# Patient Record
Sex: Female | Born: 1970
Health system: Southern US, Community
[De-identification: ages and names within clinical notes are randomized; demographics above are authoritative.]

## PROBLEM LIST (undated history)

## (undated) DIAGNOSIS — T7840XA Allergy, unspecified, initial encounter: Secondary | ICD-10-CM

## (undated) DIAGNOSIS — K219 Gastro-esophageal reflux disease without esophagitis: Secondary | ICD-10-CM

## (undated) DIAGNOSIS — C4491 Basal cell carcinoma of skin, unspecified: Secondary | ICD-10-CM

## (undated) DIAGNOSIS — K200 Eosinophilic esophagitis: Secondary | ICD-10-CM

## (undated) DIAGNOSIS — Q7962 Hypermobile Ehlers-Danlos syndrome: Secondary | ICD-10-CM

## (undated) DIAGNOSIS — M1990 Unspecified osteoarthritis, unspecified site: Secondary | ICD-10-CM

## (undated) DIAGNOSIS — D333 Benign neoplasm of cranial nerves: Secondary | ICD-10-CM

## (undated) DIAGNOSIS — F419 Anxiety disorder, unspecified: Secondary | ICD-10-CM

## (undated) DIAGNOSIS — D649 Anemia, unspecified: Secondary | ICD-10-CM

## (undated) DIAGNOSIS — E785 Hyperlipidemia, unspecified: Secondary | ICD-10-CM

## (undated) DIAGNOSIS — F32A Depression, unspecified: Secondary | ICD-10-CM

## (undated) HISTORY — DX: Basal cell carcinoma of skin, unspecified: C44.91

## (undated) HISTORY — PX: EXCISION VAGINAL CYST: SHX5825

## (undated) HISTORY — DX: Allergy, unspecified, initial encounter: T78.40XA

## (undated) HISTORY — DX: Gastro-esophageal reflux disease without esophagitis: K21.9

## (undated) HISTORY — DX: Anxiety disorder, unspecified: F41.9

## (undated) HISTORY — PX: COLONOSCOPY: SHX174

## (undated) HISTORY — PX: BUNIONECTOMY: SHX129

## (undated) HISTORY — DX: Unspecified osteoarthritis, unspecified site: M19.90

## (undated) HISTORY — DX: Hyperlipidemia, unspecified: E78.5

## (undated) HISTORY — DX: Anemia, unspecified: D64.9

## (undated) HISTORY — PX: UPPER GASTROINTESTINAL ENDOSCOPY: SHX188

## (undated) HISTORY — DX: Hypermobile Ehlers-Danlos syndrome: Q79.62

## (undated) HISTORY — DX: Eosinophilic esophagitis: K20.0

## (undated) HISTORY — DX: Benign neoplasm of cranial nerves: D33.3

## (undated) HISTORY — DX: Depression, unspecified: F32.A

---

## 2000-08-15 ENCOUNTER — Inpatient Hospital Stay (HOSPITAL_COMMUNITY): Admission: AD | Admit: 2000-08-15 | Discharge: 2000-08-18 | Payer: Self-pay | Admitting: Obstetrics and Gynecology

## 2000-08-30 ENCOUNTER — Encounter: Admission: RE | Admit: 2000-08-30 | Discharge: 2000-11-28 | Payer: Self-pay | Admitting: Obstetrics and Gynecology

## 2000-09-22 ENCOUNTER — Other Ambulatory Visit: Admission: RE | Admit: 2000-09-22 | Discharge: 2000-09-22 | Payer: Self-pay | Admitting: Obstetrics and Gynecology

## 2000-12-23 ENCOUNTER — Encounter: Admission: RE | Admit: 2000-12-23 | Discharge: 2001-01-22 | Payer: Self-pay | Admitting: Obstetrics and Gynecology

## 2001-09-26 ENCOUNTER — Other Ambulatory Visit: Admission: RE | Admit: 2001-09-26 | Discharge: 2001-09-26 | Payer: Self-pay | Admitting: Obstetrics and Gynecology

## 2002-10-05 ENCOUNTER — Other Ambulatory Visit: Admission: RE | Admit: 2002-10-05 | Discharge: 2002-10-05 | Payer: Self-pay | Admitting: Obstetrics and Gynecology

## 2003-05-06 ENCOUNTER — Other Ambulatory Visit: Admission: RE | Admit: 2003-05-06 | Discharge: 2003-05-06 | Payer: Self-pay | Admitting: Obstetrics and Gynecology

## 2003-09-16 ENCOUNTER — Other Ambulatory Visit: Admission: RE | Admit: 2003-09-16 | Discharge: 2003-09-16 | Payer: Self-pay | Admitting: Obstetrics and Gynecology

## 2003-10-17 ENCOUNTER — Inpatient Hospital Stay (HOSPITAL_COMMUNITY): Admission: AD | Admit: 2003-10-17 | Discharge: 2003-10-17 | Payer: Self-pay | Admitting: Obstetrics and Gynecology

## 2003-10-18 ENCOUNTER — Inpatient Hospital Stay (HOSPITAL_COMMUNITY): Admission: AD | Admit: 2003-10-18 | Discharge: 2003-10-18 | Payer: Self-pay | Admitting: Obstetrics & Gynecology

## 2003-10-21 ENCOUNTER — Inpatient Hospital Stay (HOSPITAL_COMMUNITY): Admission: AD | Admit: 2003-10-21 | Discharge: 2003-10-26 | Payer: Self-pay | Admitting: *Deleted

## 2003-10-31 ENCOUNTER — Inpatient Hospital Stay (HOSPITAL_COMMUNITY): Admission: AD | Admit: 2003-10-31 | Discharge: 2003-11-03 | Payer: Self-pay | Admitting: *Deleted

## 2003-11-07 ENCOUNTER — Ambulatory Visit (HOSPITAL_COMMUNITY): Admission: RE | Admit: 2003-11-07 | Discharge: 2003-11-07 | Payer: Self-pay | Admitting: Obstetrics and Gynecology

## 2003-11-10 ENCOUNTER — Inpatient Hospital Stay (HOSPITAL_COMMUNITY): Admission: AD | Admit: 2003-11-10 | Discharge: 2003-11-11 | Payer: Self-pay | Admitting: Obstetrics and Gynecology

## 2003-11-11 ENCOUNTER — Ambulatory Visit (HOSPITAL_COMMUNITY): Admission: RE | Admit: 2003-11-11 | Discharge: 2003-11-11 | Payer: Self-pay | Admitting: Obstetrics and Gynecology

## 2003-11-16 ENCOUNTER — Inpatient Hospital Stay (HOSPITAL_COMMUNITY): Admission: AD | Admit: 2003-11-16 | Discharge: 2003-11-18 | Payer: Self-pay | Admitting: Obstetrics and Gynecology

## 2003-12-18 ENCOUNTER — Encounter: Admission: RE | Admit: 2003-12-18 | Discharge: 2004-01-17 | Payer: Self-pay | Admitting: Obstetrics and Gynecology

## 2003-12-27 ENCOUNTER — Other Ambulatory Visit: Admission: RE | Admit: 2003-12-27 | Discharge: 2003-12-27 | Payer: Self-pay | Admitting: Obstetrics and Gynecology

## 2004-02-13 ENCOUNTER — Encounter: Admission: RE | Admit: 2004-02-13 | Discharge: 2004-03-14 | Payer: Self-pay | Admitting: Obstetrics and Gynecology

## 2004-09-26 IMAGING — US US OB LIMITED
1 series · 14 of 25 positions shown · non-contrast
Comparison: none

CLINICAL DATA: Low amniotic fluid volume.

[Series 1: unknown · 0.32mm/px · 14 of 25 slices shown]
[im 1/25]
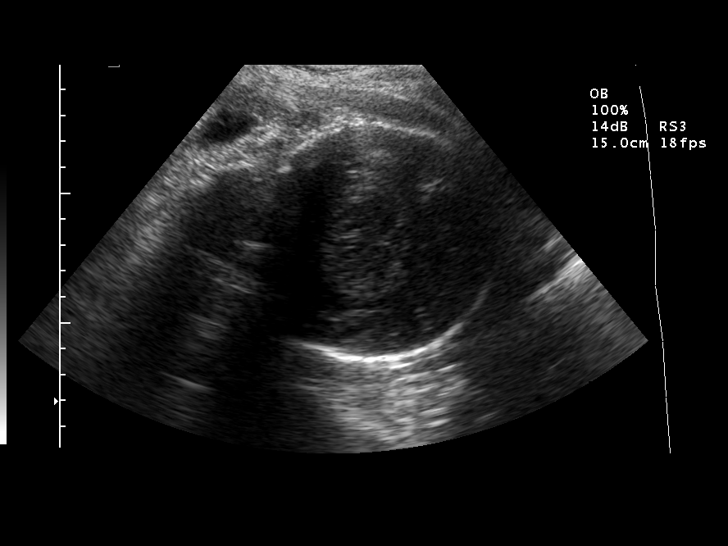
[im 3/25]
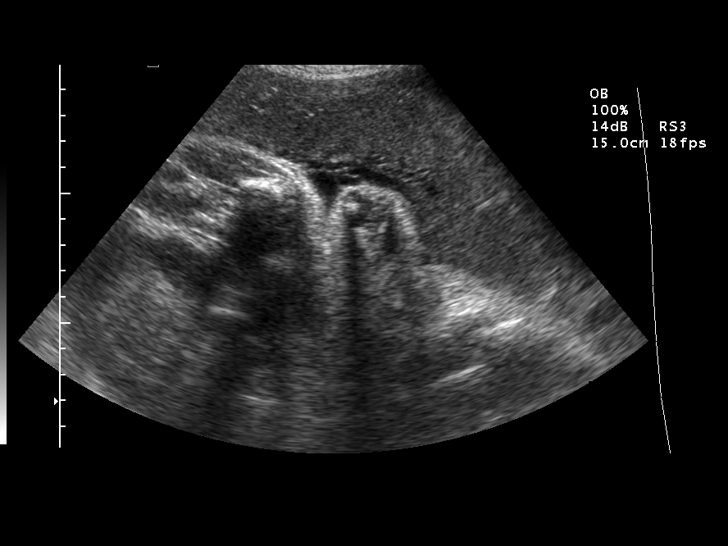
[im 5/25]
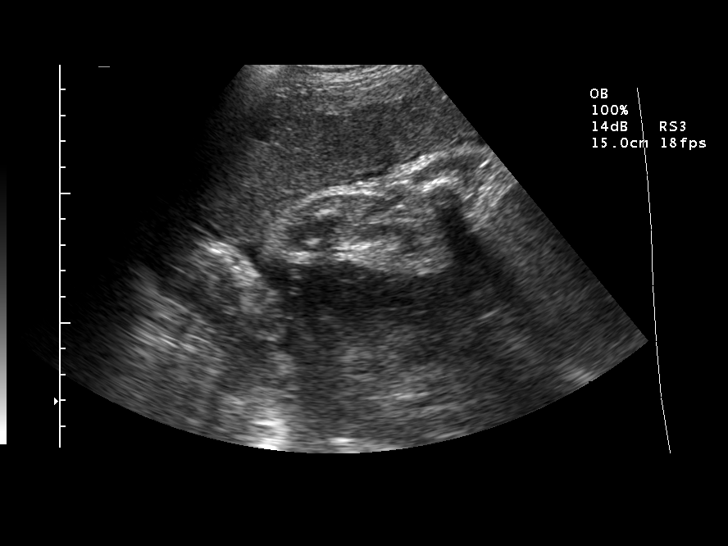
[im 7/25]
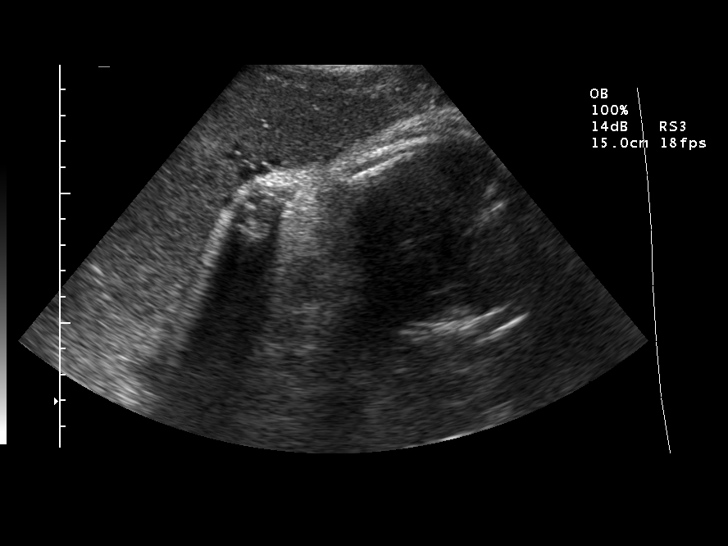
[im 9/25]
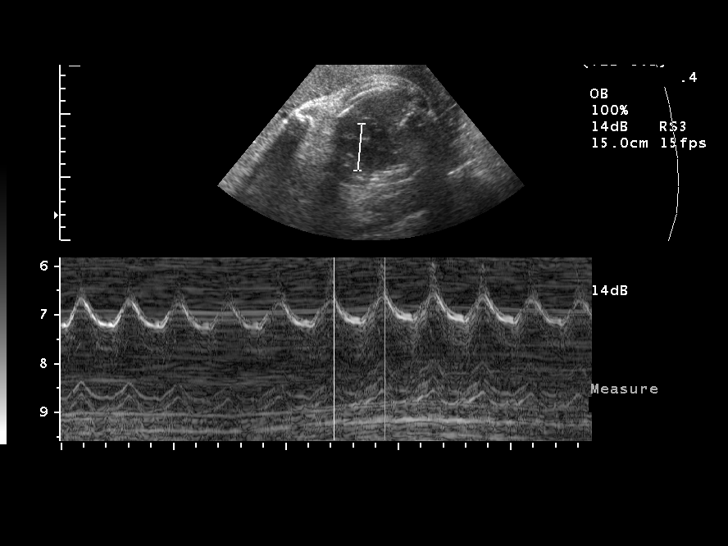
[im 10/25]
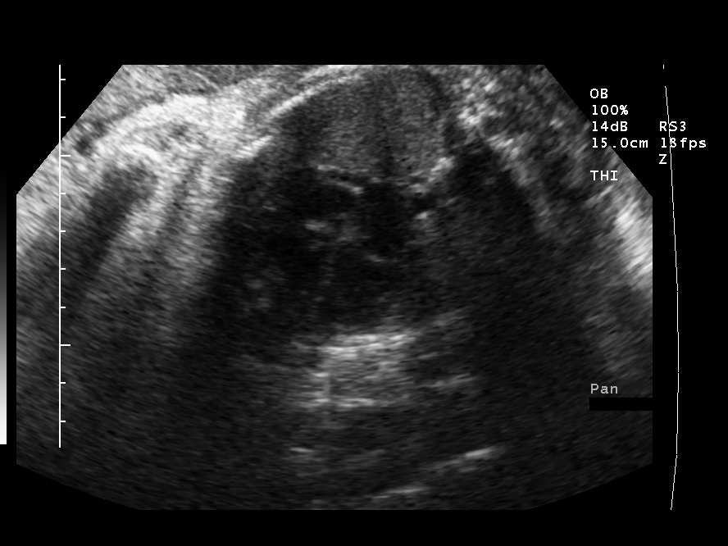
[im 12/25]
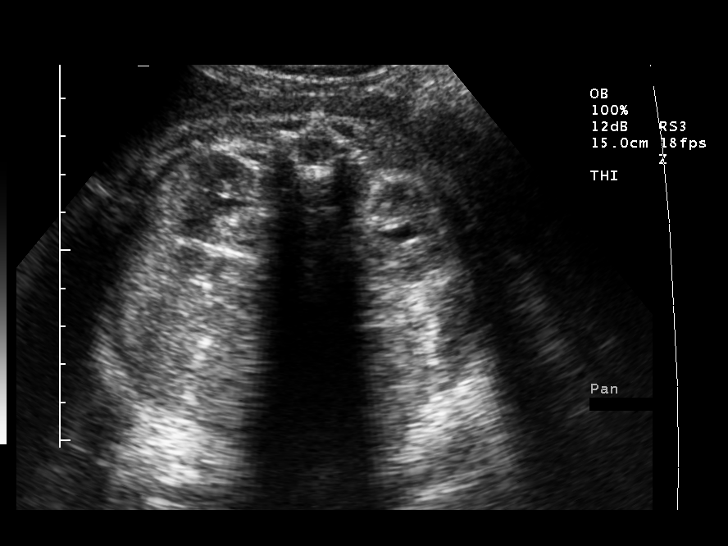
[im 14/25]
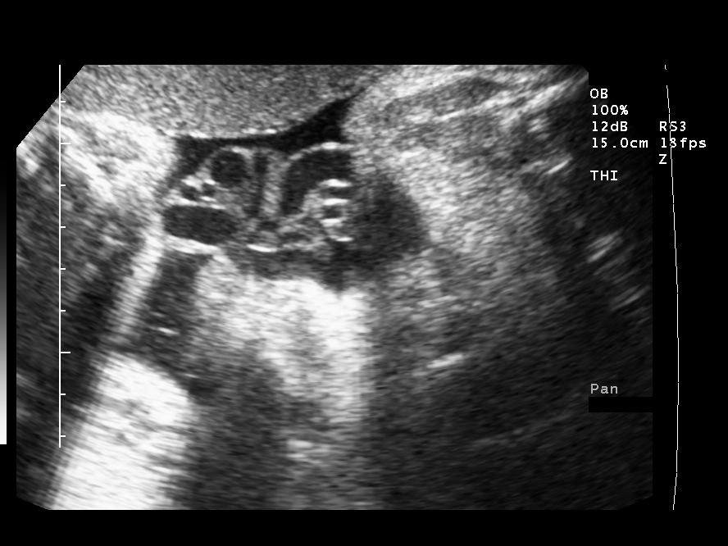
[im 16/25]
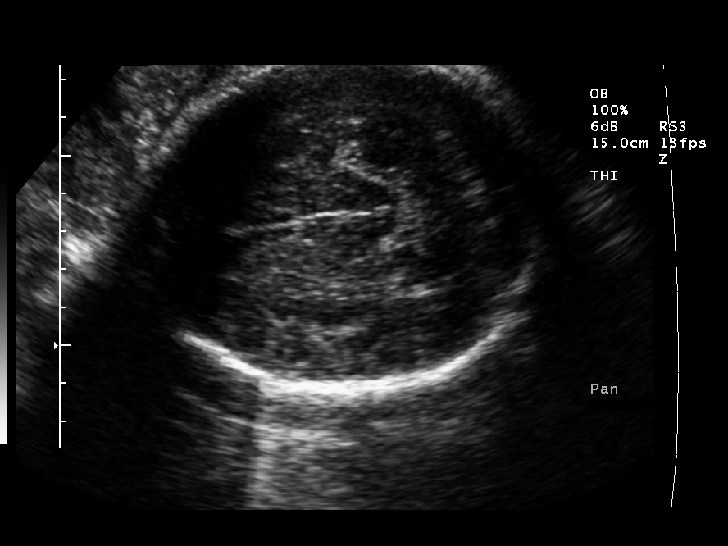
[im 17/25]
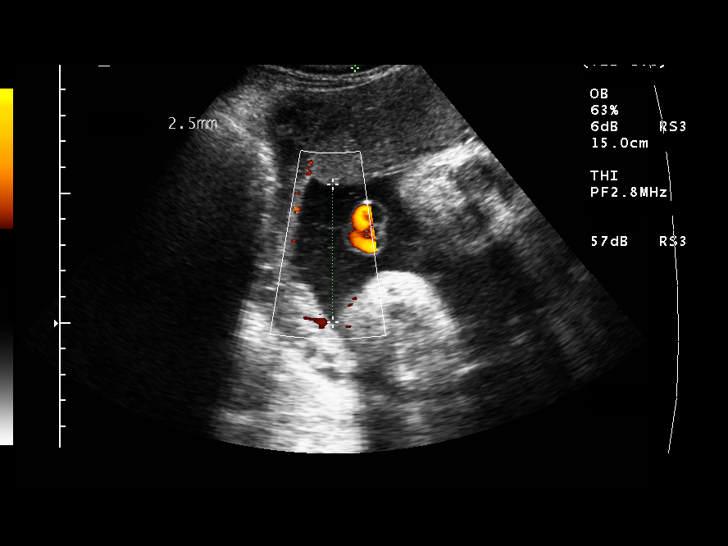
[im 19/25]
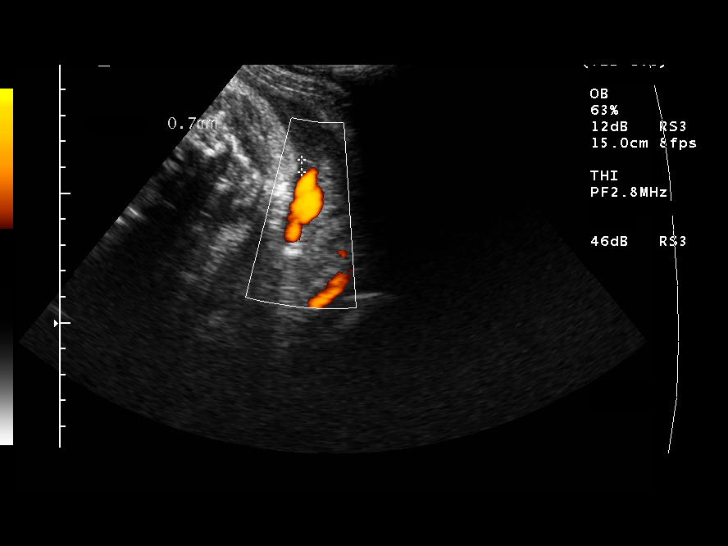
[im 21/25]
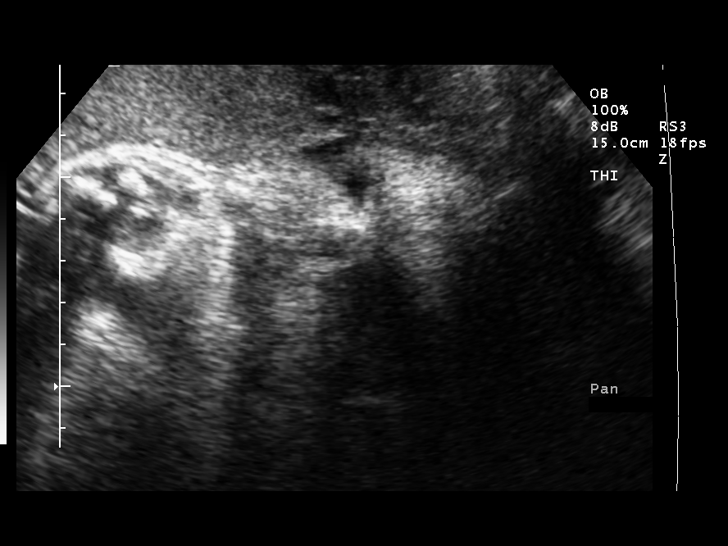
[im 23/25]
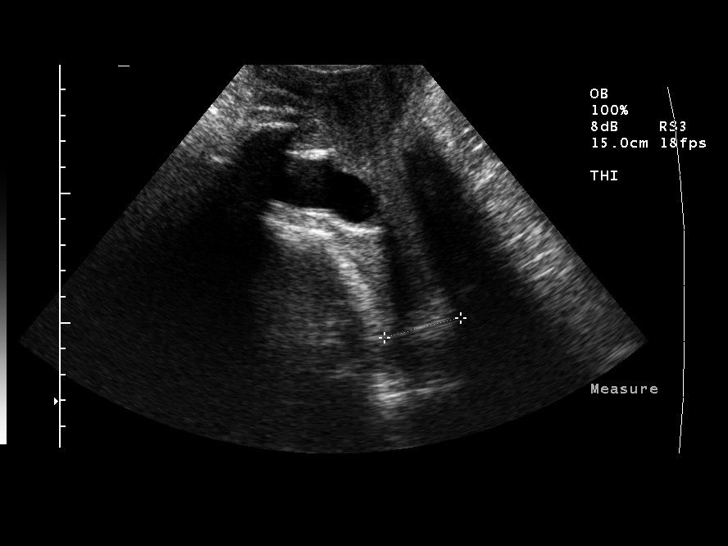
[im 25/25]
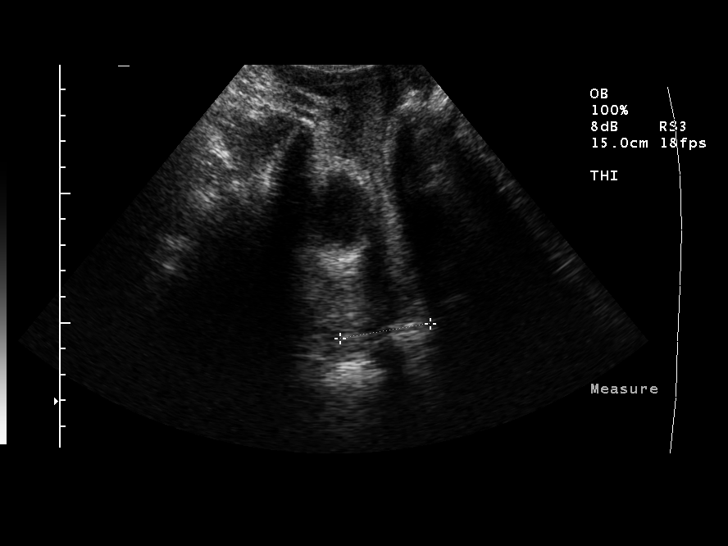

[14 of 25 positions shown; findings below may reference images not displayed]

LIMITED OBSTETRICAL ULTRASOUND
Number of Fetuses:  1
Heart Rate:  133
Movement:  Yes
Breathing:  No
Presentation:  Cephalic
Placental Location:  Anterior
Grade:  II
Previa:  No
Amniotic Fluid (Subjective):  Decreased
Amniotic Fluid (Objective):  8.6 cm AFI (5th -95th%ile =   7.7 ? 24.9 cm for 36 wks)

Fetal measurements and complete anatomic evaluation were not requested.  The following fetal anatomy was visualized during this exam:  Lateral ventricles, thalami, four chamber heart, stomach, 3-vessel cord, kidneys, bladder and diaphragm.  

MATERNAL FINDINGS
Cervix:  3.0 cm Translabially
IMPRESSION: Subjectively decreased with quantitatively low normal amniotic fluid volume.  Today the amniotic fluid index measures 8.6 cm (7.7 ? 24.9 cm).  This represents an interval increase from the previous exam at which time it measured 7.6 cm.  
No late developing fetal anatomic abnormalities are identified associated with the lateral ventricles, four chamber heart, stomach, kidneys or bladder.
Normal cervical length.

## 2009-08-06 ENCOUNTER — Encounter: Admission: RE | Admit: 2009-08-06 | Discharge: 2009-08-06 | Payer: Self-pay | Admitting: Obstetrics and Gynecology

## 2010-08-23 ENCOUNTER — Encounter: Payer: Self-pay | Admitting: Obstetrics and Gynecology

## 2010-09-08 ENCOUNTER — Ambulatory Visit (HOSPITAL_COMMUNITY)
Admission: AD | Admit: 2010-09-08 | Discharge: 2010-09-08 | Disposition: A | Discharge status: Home / Self Care | Payer: BC Managed Care – PPO | Source: Ambulatory Visit | Attending: Obstetrics and Gynecology | Admitting: Obstetrics and Gynecology

## 2010-09-08 ENCOUNTER — Other Ambulatory Visit: Payer: Self-pay | Admitting: Obstetrics and Gynecology

## 2010-09-08 DIAGNOSIS — N9489 Other specified conditions associated with female genital organs and menstrual cycle: Secondary | ICD-10-CM | POA: Insufficient documentation

## 2010-09-08 DIAGNOSIS — N9089 Other specified noninflammatory disorders of vulva and perineum: Secondary | ICD-10-CM | POA: Insufficient documentation

## 2010-09-08 LAB — CBC
MCH: 27.1 pg (ref 26.0–34.0)
Platelets: 259 10*3/uL (ref 150–400)
RBC: 4.58 MIL/uL (ref 3.87–5.11)

## 2010-09-08 LAB — HCG, SERUM, QUALITATIVE: Preg, Serum: NEGATIVE

## 2010-09-25 NOTE — H&P (Signed)
  NAMEBRENDI, Andrea Reyes                ACCOUNT NO.:  [id number]  MEDICAL RECORD NO.:  [id number]           PATIENT TYPE:  O  LOCATION:  [facility]                          [facility]  PHYSICIAN:  Lenoard Aden, M.D.DATE OF BIRTH:  [birth year]  DATE OF ADMISSION:  09/08/2010 DATE OF DISCHARGE:                             HISTORY & PHYSICAL   CHIEF COMPLAINT:  Painful enlarging vulvar mass.  She is a 40 year old white female G2, P2 with a small mass in the right labia minora which has increased in size and discomfort now for surgical intervention.  She has a history of past medical history remarkable for allergies to ERYTHROMYCIN.  She has a family history of diabetes, heart disease and cervical cancer. She is a nonsmoker, nondrinker.  She denies domestic or physical violence.  Obstetric history remarkable for 2 vaginal deliveries and a surgical history for laparoscopy in 1995.  Her medications include Zoloft and a multivitamin.  PHYSICAL EXAMINATION:  GENERAL:  She is a well-developed, well-nourished white female. VITAL SIGNS:  Weight of 140 pounds, height of 5 feet 2-1/2 inches. HEENT:  Normal. NECK:  Supple.  Full range of motion. LUNGS:  Clear. HEART:  Regular rhythm. ABDOMEN:  Soft, scaphoid, nontender. PELVIC:  A 2-3 cm sessile, smooth mass in the right labia minora which is tender to palpation.  The uterus was normal size, shape and contour. No vaginal or cervical lesions noted.  No adnexal masses are appreciated.  No groin lymphadenopathy is appreciated at this time. EXTREMITIES:  There are no cords. NEUROLOGIC:  Nonfocal. SKIN:  Intact.  IMPRESSION:  Enlarging tender right vulvar lesion.  PLAN:  To proceed with surgical management and excision of volar mass. Risks of anesthesia, infection, bleeding, injury to abdominal organs and need for repair is discussed, delayed versus immediate complications to include bowel or bladder injury with possible need for repair  noted, inability to cure cancer with removal of potential lesion is discussed. The patient acknowledges and wishes to proceed.     Lenoard Aden, M.D.     RJT/MEDQ  D:  09/08/2010  T:  09/09/2010  Job:  [id number]  Electronically Signed by Olivia Mackie M.D. on 09/25/2010 11:07:13 AM

## 2010-09-25 NOTE — Op Note (Signed)
  NAMESTEPHANI, Andrea Reyes                ACCOUNT NO.:  [id number]  MEDICAL RECORD NO.:  [id number]           PATIENT TYPE:  O  LOCATION:  [facility]                          [facility]  PHYSICIAN:  Lenoard Aden, M.D.DATE OF BIRTH:  [birth year]  DATE OF PROCEDURE:  09/08/2010 DATE OF DISCHARGE:                              OPERATIVE REPORT   PREOPERATIVE DIAGNOSIS:  Enlarging tender symptomatic, right vulvar mass.  POSTOPERATIVE DIAGNOSIS:  Enlarging tender symptomatic, right vulvar mass.  PROCEDURE:  Excision of right vulvar mass.  SURGEON:  Lenoard Aden, MD  ANESTHESIA:  General and local.  ESTIMATED BLOOD LOSS:  Less than 50 mL.  COMPLICATIONS:  None.  DRAINS:  None.  COUNTS:  Correct.  Patient to recovery in good condition.  SPECIMEN:  Subcutaneous right labial mass, solid irregular mass sent to Pathology.  BRIEF OPERATIVE NOTE:  After being apprised of the risks of anesthesia, infection, bleeding, injury to abdominal organs, need for repair, delayed versus immediate complications to include injury to bladder with possible need for repair, patient was brought to the operating room where she was administered a general anesthetic without complications, prepped and draped in usual sterile fashion, catheterized until the bladder was empty.  Examination of the right labia minora reveals a mobile, irregularly shaped subcutaneous mass.  A small linear incision was made over the innermost portion of the mass which due to its mobility is pressed up against the anterior aspect of this incision. Sharp dissection reveals a white, irregularly shaped solid mass which was then grasped gently using the tips of a ring forceps.  This was used to elevate the mass and dissection along the lateral edges and then to the base of the mass were done with minimal difficulty.  The blood supply was identified, grasped using hemostat, and cauterized using electrocautery with a needle tip.  Further dissection along with base of this mass was used to completely excise the mass in total.  At this time, the bed created by the mass was cauterized using electrocautery and then closed using interrupted 3-0 Vicryl Rapide sutures.  The skin edges were then reapproximated using a 3-0 Vicryl Rapide in an interrupted fashion.  A dilute Marcaine solution 10 mL of 0.25% solution was placed and good hemostasis was achieved.  Pathology, specimen was sent to pathology for permanent section.  The bladder was then emptied using a red rubber catheter at the end of the procedure. The patient tolerated procedure well, was awake and transferred to recovery in good condition.     Lenoard Aden, M.D.     RJT/MEDQ  D:  09/08/2010  T:  09/09/2010  Job:  [id number]  Electronically Signed by Olivia Mackie M.D. on 09/25/2010 11:07:18 AM

## 2010-11-19 ENCOUNTER — Other Ambulatory Visit: Payer: Self-pay | Admitting: Obstetrics and Gynecology

## 2010-11-19 DIAGNOSIS — Z1231 Encounter for screening mammogram for malignant neoplasm of breast: Secondary | ICD-10-CM

## 2010-11-30 ENCOUNTER — Ambulatory Visit: Payer: BC Managed Care – PPO

## 2010-12-14 ENCOUNTER — Ambulatory Visit
Admission: RE | Admit: 2010-12-14 | Discharge: 2010-12-14 | Disposition: A | Discharge status: Home / Self Care | Payer: BC Managed Care – PPO | Source: Ambulatory Visit | Attending: Obstetrics and Gynecology | Admitting: Obstetrics and Gynecology

## 2010-12-14 DIAGNOSIS — Z1231 Encounter for screening mammogram for malignant neoplasm of breast: Secondary | ICD-10-CM

## 2010-12-15 ENCOUNTER — Other Ambulatory Visit: Payer: Self-pay | Admitting: Obstetrics and Gynecology

## 2010-12-15 DIAGNOSIS — R928 Other abnormal and inconclusive findings on diagnostic imaging of breast: Secondary | ICD-10-CM

## 2010-12-18 NOTE — H&P (Signed)
[facility]  Patient:    Andrea Reyes, Andrea Reyes                         MRN: [id number] Adm. Date:  08/15/00 Attending:  Lenoard Aden, M.D. [facility]   History and Physical  CHIEF COMPLAINT:              Oligohydramnios.  HISTORY OF PRESENT ILLNESS:   [age]-year-old white female, G2, P0, EDD of August 30, 2000, with size-date discrepancy, new-onset oligohydramnios, with an AFI of 5.1 in the office today.  The patient presented for cervical ripening and induction.  MEDICATIONS:                  Prenatal vitamins and iron and Valtrex.  ALLERGIES:                    ERYTHROMYCIN.  PAST MEDICAL HISTORY:         1. Irritable bowel syndrome, for which she has                                  been given the diagnosis presumptively.                               2. Therapeutic abortion in 1986.                               3. Laparoscopy in 1995.                               4. Hysteroscopy in 1995.  GYNECOLOGICAL HISTORY:        HSV, with rare outbreaks, currently on suppression.  FAMILY HISTORY:               Remarkable for varicosities, thrombophlebitis, myocardial infarction, insulin-dependent diabetes, and lupus.  PRENATAL LABORATORY DATA:     Blood type A negative, Rh antibody negative, VDRL nonreactive, rubella immune.  Hepatitis B surface antigen negative.  HIV nonreactive.  GC and chlamydia negative.  GBS positive.  OBSTETRICAL HISTORY:          Pregnancy complicated by fetal bradyarrhythmia which was diagnosed at 17 weeks.  The patient had a normal targeted ultrasound in addition to her normal laboratory work and a normal fetal echocardiogram. The patient had unexplained second-trimester bleeding with normal follow-up ultrasound at 28 weeks, with normal interval growth.  The patient has had gestational anemia.  She is GBS-positive.  PHYSICAL EXAMINATION:  GENERAL:                      Well-developed, well-nourished  white female in no apparent distress.  HEENT:                        Normal.  LUNGS:                        Clear.  HEART:                        Regular rhythm.  ABDOMEN:  Soft, gravid, nontender.  Estimated fetal weight of 7 pounds.  PELVIC:                       Cervix is 1-2 cm thick, vertex, at -1 to -2.  EXTREMITIES:                  No cords.  NEUROLOGIC:                   Nonfocal.  IMPRESSION:                   1. Term intrauterine pregnancy with new-onset                                  oligohydramnios.                               2. Group B strep-positive.                               3. History of fetal arrhythmia with normal fetal                                  echocardiogram.                               4. History of unexplained second-trimester                                  bleeding.                               5. Herpes simplex virus-positive with negative                                  evidence of outbreak, on Valtrex prophylaxis.  PLAN:                         Proceed with cervical ripening, induction.  The risks of anesthesia, infection, bleeding, possible need for C-section noted. Inability to prevent all subclinical herpes in addition to possible fetal infection are discussed.  The patient acknowledges and will proceed. DD:  08/15/00 TD:  08/15/00 Job: [id number]

## 2010-12-18 NOTE — Discharge Summary (Signed)
NAMEKEAISHA, SUBLETTE                          ACCOUNT NO.:  [id number]   MEDICAL RECORD NO.:  [id number]                   PATIENT TYPE:  INP   LOCATION:  9110                                 [facility]  [facility]   PHYSICIAN:  Lenoard Aden, M.D.             DATE OF BIRTH:  May 28, 1971   DATE OF ADMISSION:  10/31/2003  DATE OF DISCHARGE:  11/03/2003                                 DISCHARGE SUMMARY   HOSPITAL COURSE:  The patient was admitted for oligohydramnios in the third  trimester, was put on IV fluid hydration.  Reassuring fetal surveillance  noted.  She is discharged to home day #3.  Follow up in the office for an  AFI in 1 week.                                               Lenoard Aden, M.D.    RJT/MEDQ  D:  12/19/2003  T:  12/19/2003  Job:  [id number]

## 2010-12-18 NOTE — H&P (Signed)
NAMEDELORISE, HUNKELE                          ACCOUNT NO.:  [id number]   MEDICAL RECORD NO.:  [id number]                   PATIENT TYPE:  INP   LOCATION:  9170                                 [facility]   PHYSICIAN:  Lenoard Aden, M.D.             DATE OF BIRTH:  1971-05-05   DATE OF ADMISSION:  10/21/2003  DATE OF DISCHARGE:                                HISTORY & PHYSICAL   CHIEF COMPLAINT:  Oligohydramnios.   HISTORY OF PRESENT ILLNESS:  The patient is a 40 year old white female, G2,  P61, EDD of Dec 08, 2003, at 33-1/7 weeks, who presents with new onset  oligohydramnios and an AFI of less than 1 today.  Ultrasound in the office  revealed an estimated fetal weight of 4 pounds 11 ounces, normal anterior  placenta, and a BPP of 6/8.  The patient reports good fetal movement.  She  denies leakage of fluid.  She does report intermittent contractions and has  been treated for preterm labor.   ALLERGIES:  She has no known drug allergies.   MEDICATIONS:  Prenatal vitamins.   OBSTETRICAL HISTORY:  Remarkable for one vaginal delivery in 2002.   GYNECOLOGIC HISTORY:  Remarkable for herpes with no evidence of active  outbreaks.  She was placed on Valtrex prophylaxis.   PAST SURGICAL HISTORY:  Remarkable for laparoscopy in 1994.   FAMILY HISTORY:  Depression, type 2 diabetes, rheumatoid arthritis, and  heart disease.   PRENATAL COURSE:  Otherwise uncomplicated.  The patient received RhoGAM at  20 weeks.   PHYSICAL EXAMINATION:  GENERAL APPEARANCE:  She is a well-developed, well-  nourished, white female in no acute distress.  HEENT:  Normal.  LUNGS:  Clear.  HEART:  Regular rate and rhythm.  ABDOMEN:  Soft, gravid, and nontender.  PELVIC:  The cervix is 1-2 cm, thick, vertex, and -1.  Fern and nitrazine  are negative.  Wet prep is negative.  Fetal fibronectin and GBS are  performed today.  EXTREMITIES:  No cords.  NEUROLOGIC:  Nonfocal.   IMPRESSION:  1. 33-1/7 week  intrauterine pregnancy.  2. History of preterm cervical change.  Fetal fibronectin is pending.  The     patient is status post betamethasone on October 17, 2003, and October 18, 2003.  3. New onset oligohydramnios with an amniotic fluid index previously noted     to be 16.1 on September 16, 2003, followed by an amniotic fluid index of     8.9 on October 17, 2003, and now with an amniotic fluid index today of less     than 1.   PLAN:  Proceed with continuous monitoring, aggressive fluid hydration, and  repeat AFI in 48 hours.  Will deliver for fetal indications.  The plan is  discussed with the patient and her husband.  They acknowledge and will  proceed.  Lenoard Aden, M.D.    RJT/MEDQ  D:  10/21/2003  T:  10/21/2003  Job:  [id number]   cc:   [facility]

## 2010-12-18 NOTE — H&P (Signed)
NAMEQUINESHA, SELINGER                          ACCOUNT NO.:  [id number]   MEDICAL RECORD NO.:  [id number]                   PATIENT TYPE:  INP   LOCATION:  9110                                 [facility]   PHYSICIAN:  Gerri Spore B. Earlene Plater, M.D.               DATE OF BIRTH:  [birth year]   DATE OF ADMISSION:  10/31/2003  DATE OF DISCHARGE:                                HISTORY & PHYSICAL   ADMISSION DIAGNOSES:  Oligohydramnios.   HISTORY OF PRESENT ILLNESS:  A 40 year old white female gravida 2 para 1  with EDC Dec 08, 2003 at 34+ weeks with recurrent oligohydramnios with an AFI  in the office today of 5.  This ultrasound also showed good movement, tone,  and breathing for a biophysical of 6/8 with normal umbilical artery and MCA  Dopplers.  The patient had been managed as an outpatient, had been stable  for the last few days, but now returns with recurrent oligohydramnios and is  readmitted for fluids and monitoring.   PAST MEDICAL HISTORY:  HSV with no recent outbreaks.   OBSTETRICAL HISTORY:  Vaginal delivery x1.   MEDICATIONS:  Prenatal vitamins.   ALLERGIES:  None.   SURGICAL HISTORY:  Laparoscopy in 1994.   FAMILY HISTORY:  Diabetes, depression, arthritis, heart disease.   PRENATAL LABORATORY DATA:  Please see prenatal record.   PHYSICAL EXAMINATION:  VITAL SIGNS:  Blood pressure 100/70, weight 135,  fetal heart tones 138.  GENERAL:  Alert and oriented, no acute distress.  SKIN:  Warm and dry, no lesions.  HEART:  Regular rate and rhythm.  LUNGS:  Clear to auscultation.  ABDOMEN:  Fundal height 34.  Fetal heart tones noted.  PELVIC:  Cervix is unchanged at 1-2 cm dilated, 50% effaced, and -1 station,  vertex.   ASSESSMENT:  1. Thirty-four-plus-week intrauterine pregnancy.  2. Recurrent oligohydramnios.   PLAN:  Admission to [facility] for monitoring and fluids and bedrest.                                              Gerri Spore B. Earlene Plater, M.D.   WBD/MEDQ  D:   10/31/2003  T:  10/31/2003  Job:  [id number]

## 2010-12-18 NOTE — Op Note (Signed)
NAMEJLYNN, LY                          ACCOUNT NO.:  [id number]   MEDICAL RECORD NO.:  [id number]                   PATIENT TYPE:  INP   LOCATION:  9122                                 [facility]   PHYSICIAN:  Lenoard Aden, M.D.             DATE OF BIRTH:  Mar 27, 1971   DATE OF PROCEDURE:  11/16/2003  DATE OF DISCHARGE:                                 OPERATIVE REPORT   INDICATION FOR OPERATIVE VAGINAL DELIVERY:  Fetal bradycardia.   PROCEDURE:  Outlet vacuum-assisted delivery x3 pulls.   DESCRIPTION OF PROCEDURE:  After apprising the patient of the risks and  benefits of vacuum to include cephalohematoma, scalp laceration, small  incidence of intracranial hemorrhage, a Kiwi cup is placed in the proper  location, fetal vertex OA, less than 45 degrees, +4 station.  With three  pulls a full-term living female delivered over an intact perineum, Apgars 8  and 9.  Bulb suction performed, cord blood collected.  Nuchal cord x1  reduced.  Placenta delivered spontaneously and intact.  Three-vessel cord  noted.  No cervical lacerations noted.  Bilateral periurethral lacerations  noted without need for repair.  Estimated blood loss 300 mL, no  complications.  Mother and baby recovering in good condition.                                               Lenoard Aden, M.D.    RJT/MEDQ  D:  11/16/2003  T:  11/18/2003  Job:  [id number]

## 2010-12-18 NOTE — H&P (Signed)
Andrea Reyes, Andrea Reyes                          ACCOUNT NO.:  [id number]   MEDICAL RECORD NO.:  [id number]                   PATIENT TYPE:   LOCATION:                                       [facility]   PHYSICIAN:  Lenoard Aden, M.D.             DATE OF BIRTH:  19-Aug-1970   DATE OF ADMISSION:  11/16/2003  DATE OF DISCHARGE:                                HISTORY & PHYSICAL   CHIEF COMPLAINT:  Oligohydramnios.   HISTORY OF PRESENT ILLNESS:  The patient is a 40 year old white female G2,  P1 estimated date of delivery Dec 08, 2003 at 37 weeks with refractory  oligohydramnios, now at 37 weeks. The patient has shown normal growth. She  denies leakage of fluid or contractions.   ALLERGIES:  None.   MEDICATIONS:  Prenatal vitamins. Also include Valtrex for prophylaxis.   OBSTETRIC HISTORY:  Remarkable for vaginal delivery in 2002.   GYNECOLOGIC HISTORY:  Remarkable for HSV. Pregnancy course otherwise  uncomplicated. The patient did receive RhoGAM for RH negative status. She  has a blood type A negative. Rubella immune, hepatitis negative, HIV  negative.   PAST SURGICAL HISTORY:  Remarkable for laparoscopy in 1994.   FAMILY HISTORY:  Depression, diabetes, rheumatoid arthritis and heart  disease.   PHYSICAL EXAMINATION:  GENERAL:  A well developed, well nourished white  female in no acute distress.  HEENT:  Normal.  LUNGS: Clear.  HEART:  Regular rhythm.  ABDOMEN:  Soft, gravid, and nontender.  GENITALIA:  Cervix is 2 to 3 cm, 2 cm vertex, minus 1.  EXTREMITIES:  No cords.  NEUROLOGIC:  Examination is nonfocal.   IMPRESSION:  A 37 week intrauterine pregnancy with refractory  oligohydramnios.   PLAN:  Proceed with induction at [facility]. This lady to be delivered  at labor and delivery on November 16, 2003.                                               Lenoard Aden, M.D.    RJT/MEDQ  D:  11/14/2003  T:  11/14/2003  Job:  [id number]

## 2010-12-18 NOTE — Op Note (Signed)
[facility]  Patient:    Andrea Reyes, Andrea Reyes                       MRN: [id number] Proc. Date: 08/16/00 Adm. Date:  [id number] Attending:  Genia Del CC:         [facility]   Operative Report  PREDELIVERY DIAGNOSIS:        Term intrauterine pregnancy with oligohydramnios and severe persistent variable decelerations with late return.  POSTDELIVERY DIAGNOSIS:       Term intrauterine pregnancy with oligohydramnios and severe persistent variable decelerations with late return.  PROCEDURE:                    Outlet vacuum assisted vaginal delivery and central median episiotomy and repair.  SURGEON:                      Lenoard Aden, M.D.  ANESTHESIA:                   Epidural.  ESTIMATED BLOOD LOSS:         500 cc.  COMPLICATIONS:                None.  BRIEF NOTE:                   After being apprised of the risks and benefits of vacuum assisted vaginal delivery and the need to perform this due to fetal indications, the Mityvac suction cup was applied to the fetal vertex LOA less than 45 degrees at +4 to +5 station for an outlet procedure for vacuum assistance.  On three pulls with gentle traction, assisted vaginal delivery of a full term living girl with Apgars of 8 and 9 over a central median episiotomy.  Bulb suction on the perineum.  No complications.  The cord was clamped and cut.  The placenta was delivered spontaneously intact. Three-vessel cord noted.  No cervical or vaginal lacerations.  Repair of episiotomy with no extensions with a 3-0 Vicryl Rapide without complications. Estimated blood loss was 500 cc.  The patient tolerated the procedure well, recovering in good condition. DD:  08/16/00 TD:  08/16/00 Job: [id number]

## 2010-12-22 ENCOUNTER — Ambulatory Visit
Admission: RE | Admit: 2010-12-22 | Discharge: 2010-12-22 | Disposition: A | Discharge status: Home / Self Care | Payer: BC Managed Care – PPO | Source: Ambulatory Visit | Attending: Obstetrics and Gynecology | Admitting: Obstetrics and Gynecology

## 2010-12-22 DIAGNOSIS — R928 Other abnormal and inconclusive findings on diagnostic imaging of breast: Secondary | ICD-10-CM

## 2011-01-13 ENCOUNTER — Other Ambulatory Visit: Payer: Self-pay | Admitting: Obstetrics and Gynecology

## 2011-10-19 ENCOUNTER — Encounter: Payer: Self-pay | Admitting: Family Medicine

## 2011-10-19 ENCOUNTER — Ambulatory Visit (INDEPENDENT_AMBULATORY_CARE_PROVIDER_SITE_OTHER): Payer: BC Managed Care – PPO | Admitting: Family Medicine

## 2011-10-19 VITALS — BP 109/71 | HR 79 | Temp 98.2°F | Resp 16 | Ht 63.0 in | Wt 131.0 lb

## 2011-10-19 DIAGNOSIS — R05 Cough: Secondary | ICD-10-CM

## 2011-10-19 DIAGNOSIS — J209 Acute bronchitis, unspecified: Secondary | ICD-10-CM

## 2011-10-19 DIAGNOSIS — R059 Cough, unspecified: Secondary | ICD-10-CM

## 2011-10-19 LAB — POCT INFLUENZA A/B
Influenza A, POC: NEGATIVE
Influenza B, POC: NEGATIVE

## 2011-10-19 MED ORDER — HYDROCODONE-HOMATROPINE 5-1.5 MG/5ML PO SYRP: 5.0000 mL | ORAL_SOLUTION | Freq: Three times a day (TID) | ORAL | Status: AC | PRN

## 2011-10-19 MED ORDER — AZITHROMYCIN 250 MG PO TABS: ORAL_TABLET | ORAL | Status: AC

## 2011-10-19 NOTE — Progress Notes (Signed)
41 yo woman who presents with one day of myalgias and cough after being exposed to daughter, sick x 4 days.  Objective: HEENT unremarkable  Chest few rhonchi  Heart regular no murmur  Skin warm and dry  Abdomen soft nontender without HSM  Neck supple no adenopathy nor thyromegaly Results for orders placed in visit on 10/19/11  POCT INFLUENZA A/B      Component Value Range   Influenza A, POC Negative     Influenza B, POC Negative     assessment: Acute cough, worsening  Plan Z-Pak and Hydromet

## 2011-11-03 ENCOUNTER — Other Ambulatory Visit: Payer: Self-pay | Admitting: Obstetrics and Gynecology

## 2011-11-03 DIAGNOSIS — R920 Mammographic microcalcification found on diagnostic imaging of breast: Secondary | ICD-10-CM

## 2011-12-15 ENCOUNTER — Inpatient Hospital Stay: Admission: RE | Admit: 2011-12-15 | Payer: BC Managed Care – PPO | Source: Ambulatory Visit

## 2012-01-10 ENCOUNTER — Ambulatory Visit
Admission: RE | Admit: 2012-01-10 | Discharge: 2012-01-10 | Disposition: A | Discharge status: Home / Self Care | Payer: BC Managed Care – PPO | Source: Ambulatory Visit | Attending: Obstetrics and Gynecology | Admitting: Obstetrics and Gynecology

## 2012-01-10 DIAGNOSIS — R920 Mammographic microcalcification found on diagnostic imaging of breast: Secondary | ICD-10-CM

## 2012-12-29 ENCOUNTER — Other Ambulatory Visit: Payer: Self-pay | Admitting: Obstetrics and Gynecology

## 2012-12-29 DIAGNOSIS — R921 Mammographic calcification found on diagnostic imaging of breast: Secondary | ICD-10-CM

## 2013-01-26 ENCOUNTER — Ambulatory Visit
Admission: RE | Admit: 2013-01-26 | Discharge: 2013-01-26 | Disposition: A | Discharge status: Home / Self Care | Payer: BC Managed Care – PPO | Source: Ambulatory Visit | Attending: Obstetrics and Gynecology | Admitting: Obstetrics and Gynecology

## 2013-01-26 DIAGNOSIS — R921 Mammographic calcification found on diagnostic imaging of breast: Secondary | ICD-10-CM

## 2013-02-21 ENCOUNTER — Other Ambulatory Visit: Payer: Self-pay | Admitting: Dermatology

## 2013-03-27 ENCOUNTER — Other Ambulatory Visit: Payer: Self-pay | Admitting: Dermatology

## 2014-01-04 ENCOUNTER — Other Ambulatory Visit: Payer: Self-pay

## 2014-01-04 DIAGNOSIS — Z1231 Encounter for screening mammogram for malignant neoplasm of breast: Secondary | ICD-10-CM

## 2014-02-11 ENCOUNTER — Ambulatory Visit
Admission: RE | Admit: 2014-02-11 | Discharge: 2014-02-11 | Disposition: A | Discharge status: Home / Self Care | Payer: BC Managed Care – PPO | Source: Ambulatory Visit

## 2014-02-11 DIAGNOSIS — Z1231 Encounter for screening mammogram for malignant neoplasm of breast: Secondary | ICD-10-CM

## 2014-04-10 ENCOUNTER — Other Ambulatory Visit: Payer: Self-pay | Admitting: Dermatology

## 2015-01-27 ENCOUNTER — Other Ambulatory Visit: Payer: Self-pay

## 2015-01-27 DIAGNOSIS — Z1231 Encounter for screening mammogram for malignant neoplasm of breast: Secondary | ICD-10-CM

## 2015-02-27 ENCOUNTER — Ambulatory Visit: Payer: Self-pay

## 2015-07-07 ENCOUNTER — Ambulatory Visit (HOSPITAL_BASED_OUTPATIENT_CLINIC_OR_DEPARTMENT_OTHER): Payer: BLUE CROSS/BLUE SHIELD | Attending: Obstetrics and Gynecology

## 2015-08-05 ENCOUNTER — Ambulatory Visit (HOSPITAL_BASED_OUTPATIENT_CLINIC_OR_DEPARTMENT_OTHER): Payer: BLUE CROSS/BLUE SHIELD

## 2015-11-06 DIAGNOSIS — D333 Benign neoplasm of cranial nerves: Secondary | ICD-10-CM | POA: Diagnosis not present

## 2015-11-06 DIAGNOSIS — Z7189 Other specified counseling: Secondary | ICD-10-CM | POA: Diagnosis not present

## 2015-11-06 DIAGNOSIS — Z23 Encounter for immunization: Secondary | ICD-10-CM | POA: Diagnosis not present

## 2015-12-04 DIAGNOSIS — S61412A Laceration without foreign body of left hand, initial encounter: Secondary | ICD-10-CM | POA: Diagnosis not present

## 2015-12-09 DIAGNOSIS — M9903 Segmental and somatic dysfunction of lumbar region: Secondary | ICD-10-CM | POA: Diagnosis not present

## 2015-12-09 DIAGNOSIS — M9905 Segmental and somatic dysfunction of pelvic region: Secondary | ICD-10-CM | POA: Diagnosis not present

## 2015-12-11 DIAGNOSIS — Z23 Encounter for immunization: Secondary | ICD-10-CM | POA: Diagnosis not present

## 2015-12-12 DIAGNOSIS — J018 Other acute sinusitis: Secondary | ICD-10-CM | POA: Diagnosis not present

## 2015-12-30 DIAGNOSIS — G250 Essential tremor: Secondary | ICD-10-CM | POA: Diagnosis not present

## 2015-12-30 DIAGNOSIS — G43009 Migraine without aura, not intractable, without status migrainosus: Secondary | ICD-10-CM | POA: Diagnosis not present

## 2016-02-05 DIAGNOSIS — M791 Myalgia: Secondary | ICD-10-CM | POA: Diagnosis not present

## 2016-02-05 DIAGNOSIS — M9905 Segmental and somatic dysfunction of pelvic region: Secondary | ICD-10-CM | POA: Diagnosis not present

## 2016-02-05 DIAGNOSIS — M9903 Segmental and somatic dysfunction of lumbar region: Secondary | ICD-10-CM | POA: Diagnosis not present

## 2016-02-05 DIAGNOSIS — M9904 Segmental and somatic dysfunction of sacral region: Secondary | ICD-10-CM | POA: Diagnosis not present

## 2016-02-11 ENCOUNTER — Other Ambulatory Visit: Payer: Self-pay | Admitting: Chiropractic Medicine

## 2016-02-11 ENCOUNTER — Ambulatory Visit
Admission: RE | Admit: 2016-02-11 | Discharge: 2016-02-11 | Disposition: A | Discharge status: Home / Self Care | Payer: BLUE CROSS/BLUE SHIELD | Source: Ambulatory Visit | Attending: Chiropractic Medicine | Admitting: Chiropractic Medicine

## 2016-02-11 DIAGNOSIS — M791 Myalgia: Secondary | ICD-10-CM | POA: Diagnosis not present

## 2016-02-11 DIAGNOSIS — M545 Low back pain: Principal | ICD-10-CM

## 2016-02-11 DIAGNOSIS — G8929 Other chronic pain: Secondary | ICD-10-CM

## 2016-02-11 DIAGNOSIS — M9903 Segmental and somatic dysfunction of lumbar region: Secondary | ICD-10-CM | POA: Diagnosis not present

## 2016-02-11 DIAGNOSIS — M9905 Segmental and somatic dysfunction of pelvic region: Secondary | ICD-10-CM | POA: Diagnosis not present

## 2016-02-12 DIAGNOSIS — Z0000 Encounter for general adult medical examination without abnormal findings: Secondary | ICD-10-CM | POA: Diagnosis not present

## 2016-02-23 DIAGNOSIS — M9905 Segmental and somatic dysfunction of pelvic region: Secondary | ICD-10-CM | POA: Diagnosis not present

## 2016-02-23 DIAGNOSIS — M9904 Segmental and somatic dysfunction of sacral region: Secondary | ICD-10-CM | POA: Diagnosis not present

## 2016-02-23 DIAGNOSIS — M9903 Segmental and somatic dysfunction of lumbar region: Secondary | ICD-10-CM | POA: Diagnosis not present

## 2016-03-01 DIAGNOSIS — M791 Myalgia: Secondary | ICD-10-CM | POA: Diagnosis not present

## 2016-03-01 DIAGNOSIS — M9903 Segmental and somatic dysfunction of lumbar region: Secondary | ICD-10-CM | POA: Diagnosis not present

## 2016-03-01 DIAGNOSIS — M9905 Segmental and somatic dysfunction of pelvic region: Secondary | ICD-10-CM | POA: Diagnosis not present

## 2016-03-01 DIAGNOSIS — M9904 Segmental and somatic dysfunction of sacral region: Secondary | ICD-10-CM | POA: Diagnosis not present

## 2016-03-07 DIAGNOSIS — R51 Headache: Secondary | ICD-10-CM | POA: Diagnosis not present

## 2016-03-09 DIAGNOSIS — M9903 Segmental and somatic dysfunction of lumbar region: Secondary | ICD-10-CM | POA: Diagnosis not present

## 2016-03-09 DIAGNOSIS — M9904 Segmental and somatic dysfunction of sacral region: Secondary | ICD-10-CM | POA: Diagnosis not present

## 2016-03-11 DIAGNOSIS — F4320 Adjustment disorder, unspecified: Secondary | ICD-10-CM | POA: Diagnosis not present

## 2016-03-15 DIAGNOSIS — M9905 Segmental and somatic dysfunction of pelvic region: Secondary | ICD-10-CM | POA: Diagnosis not present

## 2016-03-15 DIAGNOSIS — M9904 Segmental and somatic dysfunction of sacral region: Secondary | ICD-10-CM | POA: Diagnosis not present

## 2016-03-15 DIAGNOSIS — M791 Myalgia: Secondary | ICD-10-CM | POA: Diagnosis not present

## 2016-03-15 DIAGNOSIS — M9903 Segmental and somatic dysfunction of lumbar region: Secondary | ICD-10-CM | POA: Diagnosis not present

## 2016-03-23 DIAGNOSIS — M9904 Segmental and somatic dysfunction of sacral region: Secondary | ICD-10-CM | POA: Diagnosis not present

## 2016-03-23 DIAGNOSIS — M9903 Segmental and somatic dysfunction of lumbar region: Secondary | ICD-10-CM | POA: Diagnosis not present

## 2016-03-23 DIAGNOSIS — M791 Myalgia: Secondary | ICD-10-CM | POA: Diagnosis not present

## 2016-04-07 DIAGNOSIS — M9904 Segmental and somatic dysfunction of sacral region: Secondary | ICD-10-CM | POA: Diagnosis not present

## 2016-04-07 DIAGNOSIS — M791 Myalgia: Secondary | ICD-10-CM | POA: Diagnosis not present

## 2016-04-07 DIAGNOSIS — M9905 Segmental and somatic dysfunction of pelvic region: Secondary | ICD-10-CM | POA: Diagnosis not present

## 2016-04-07 DIAGNOSIS — M9903 Segmental and somatic dysfunction of lumbar region: Secondary | ICD-10-CM | POA: Diagnosis not present

## 2016-04-14 DIAGNOSIS — M9903 Segmental and somatic dysfunction of lumbar region: Secondary | ICD-10-CM | POA: Diagnosis not present

## 2016-04-14 DIAGNOSIS — M791 Myalgia: Secondary | ICD-10-CM | POA: Diagnosis not present

## 2016-04-14 DIAGNOSIS — M9904 Segmental and somatic dysfunction of sacral region: Secondary | ICD-10-CM | POA: Diagnosis not present

## 2016-04-14 DIAGNOSIS — M9905 Segmental and somatic dysfunction of pelvic region: Secondary | ICD-10-CM | POA: Diagnosis not present

## 2016-04-27 DIAGNOSIS — G250 Essential tremor: Secondary | ICD-10-CM | POA: Diagnosis not present

## 2016-04-27 DIAGNOSIS — G44219 Episodic tension-type headache, not intractable: Secondary | ICD-10-CM | POA: Diagnosis not present

## 2016-04-27 DIAGNOSIS — G43009 Migraine without aura, not intractable, without status migrainosus: Secondary | ICD-10-CM | POA: Diagnosis not present

## 2016-06-10 DIAGNOSIS — Z01419 Encounter for gynecological examination (general) (routine) without abnormal findings: Secondary | ICD-10-CM | POA: Diagnosis not present

## 2016-06-10 DIAGNOSIS — Z1231 Encounter for screening mammogram for malignant neoplasm of breast: Secondary | ICD-10-CM | POA: Diagnosis not present

## 2016-06-10 DIAGNOSIS — Z6824 Body mass index (BMI) 24.0-24.9, adult: Secondary | ICD-10-CM | POA: Diagnosis not present

## 2016-07-01 DIAGNOSIS — M545 Low back pain: Secondary | ICD-10-CM | POA: Diagnosis not present

## 2016-07-05 DIAGNOSIS — M545 Low back pain: Secondary | ICD-10-CM | POA: Diagnosis not present

## 2016-07-19 DIAGNOSIS — M791 Myalgia: Secondary | ICD-10-CM | POA: Diagnosis not present

## 2016-07-19 DIAGNOSIS — M9903 Segmental and somatic dysfunction of lumbar region: Secondary | ICD-10-CM | POA: Diagnosis not present

## 2016-07-19 DIAGNOSIS — M9905 Segmental and somatic dysfunction of pelvic region: Secondary | ICD-10-CM | POA: Diagnosis not present

## 2016-07-19 DIAGNOSIS — M9904 Segmental and somatic dysfunction of sacral region: Secondary | ICD-10-CM | POA: Diagnosis not present

## 2016-07-29 DIAGNOSIS — M9904 Segmental and somatic dysfunction of sacral region: Secondary | ICD-10-CM | POA: Diagnosis not present

## 2016-07-29 DIAGNOSIS — M9903 Segmental and somatic dysfunction of lumbar region: Secondary | ICD-10-CM | POA: Diagnosis not present

## 2016-07-29 DIAGNOSIS — M791 Myalgia: Secondary | ICD-10-CM | POA: Diagnosis not present

## 2016-07-29 DIAGNOSIS — M9905 Segmental and somatic dysfunction of pelvic region: Secondary | ICD-10-CM | POA: Diagnosis not present

## 2016-09-02 DIAGNOSIS — D1801 Hemangioma of skin and subcutaneous tissue: Secondary | ICD-10-CM | POA: Diagnosis not present

## 2016-09-02 DIAGNOSIS — D2262 Melanocytic nevi of left upper limb, including shoulder: Secondary | ICD-10-CM | POA: Diagnosis not present

## 2016-09-02 DIAGNOSIS — D2239 Melanocytic nevi of other parts of face: Secondary | ICD-10-CM | POA: Diagnosis not present

## 2016-09-02 DIAGNOSIS — D2261 Melanocytic nevi of right upper limb, including shoulder: Secondary | ICD-10-CM | POA: Diagnosis not present

## 2016-10-18 DIAGNOSIS — G43009 Migraine without aura, not intractable, without status migrainosus: Secondary | ICD-10-CM | POA: Diagnosis not present

## 2016-10-18 DIAGNOSIS — G44219 Episodic tension-type headache, not intractable: Secondary | ICD-10-CM | POA: Diagnosis not present

## 2016-12-27 IMAGING — CR DG LUMBAR SPINE COMPLETE 4+V
5 series · 5 of 5 positions shown · non-contrast
Comparison: None.

CLINICAL DATA: Low back pain. Pain spread across posterior pelvis.
No injury.

EXAM:
LUMBAR SPINE - COMPLETE 4+ VIEW

[view not recorded (1 of 5)]
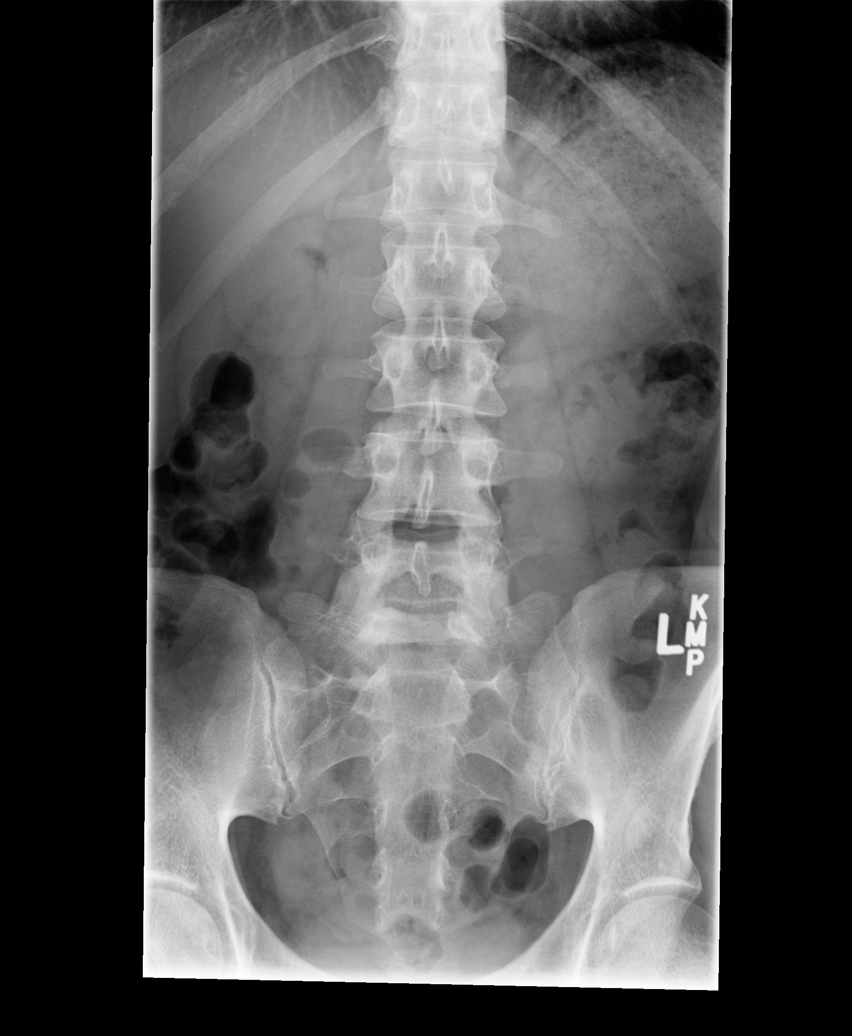

[view not recorded (2 of 5)]
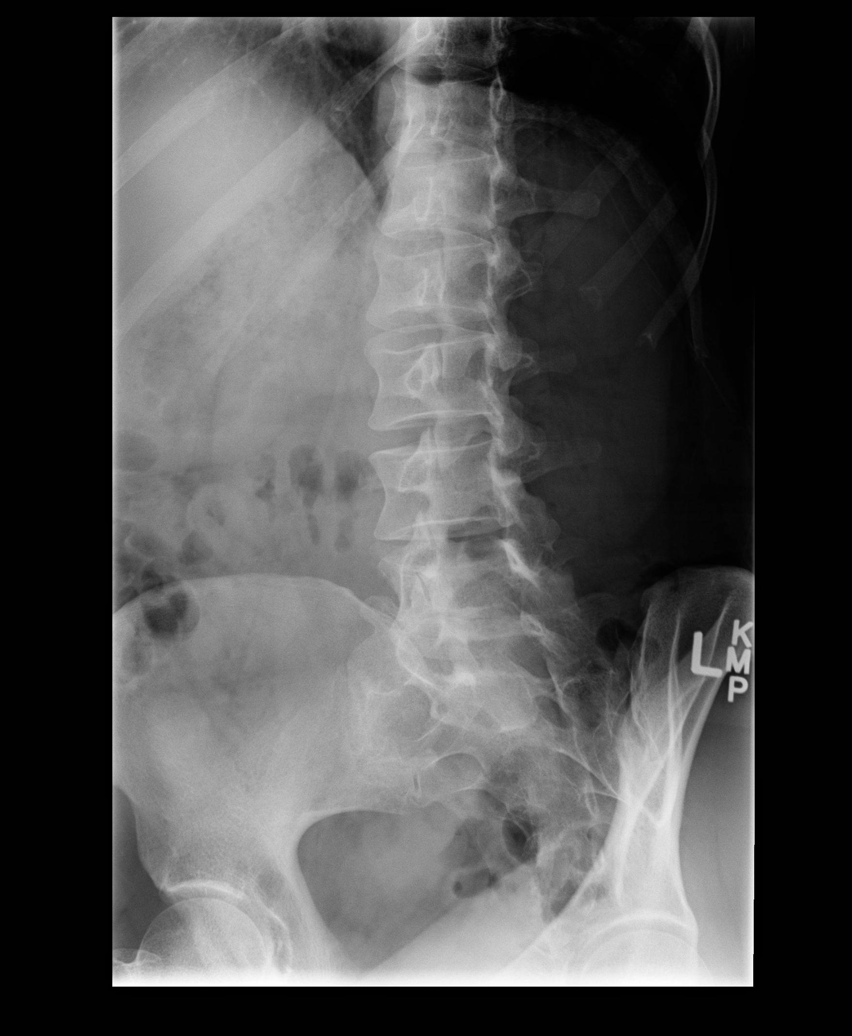

[view not recorded (3 of 5)]
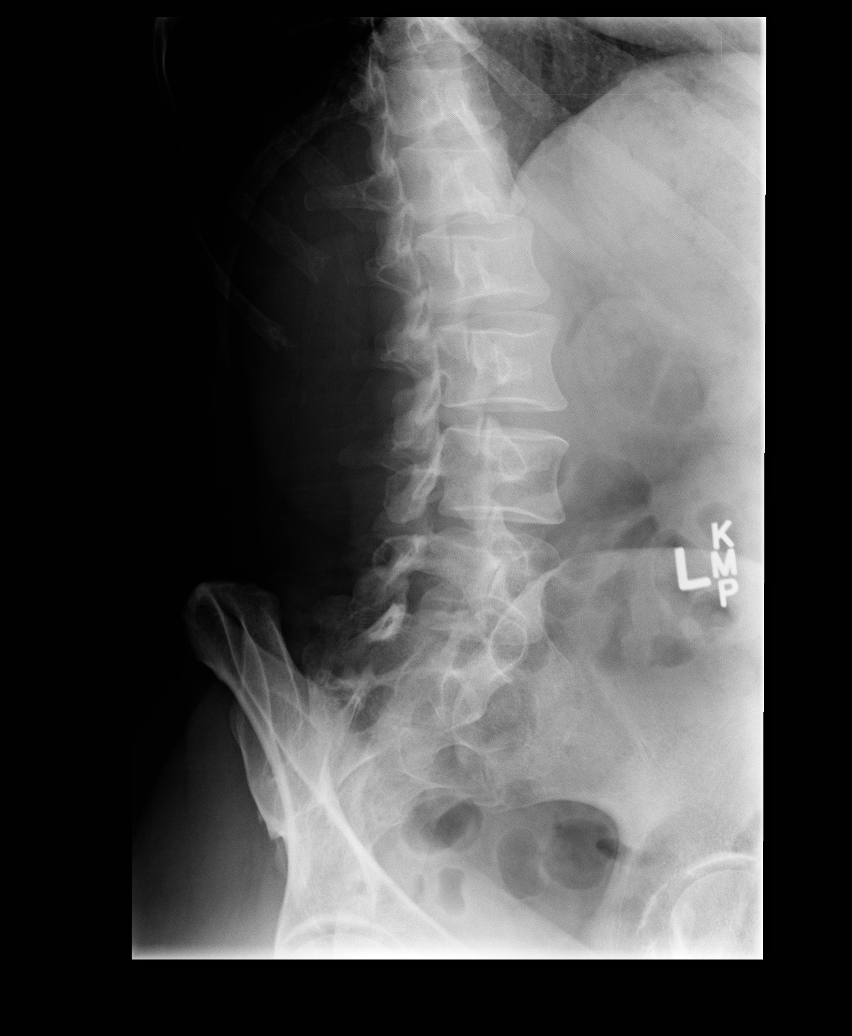

[view not recorded (4 of 5)]
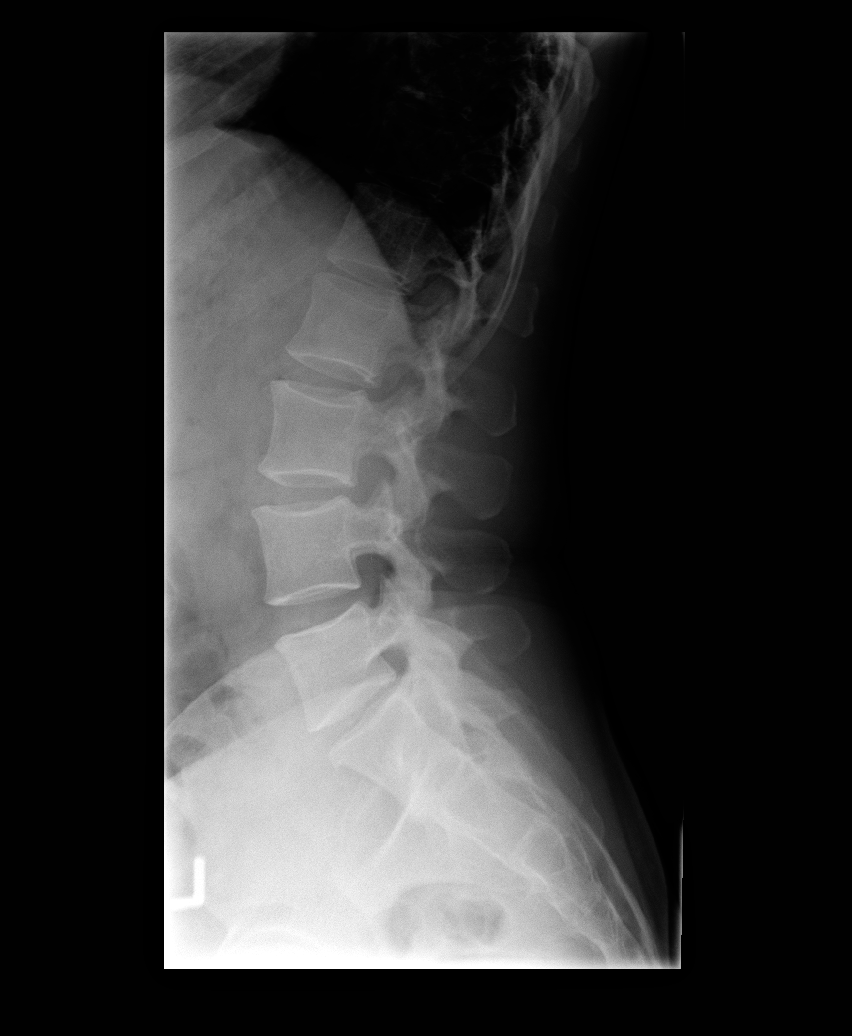

[view not recorded (5 of 5)]
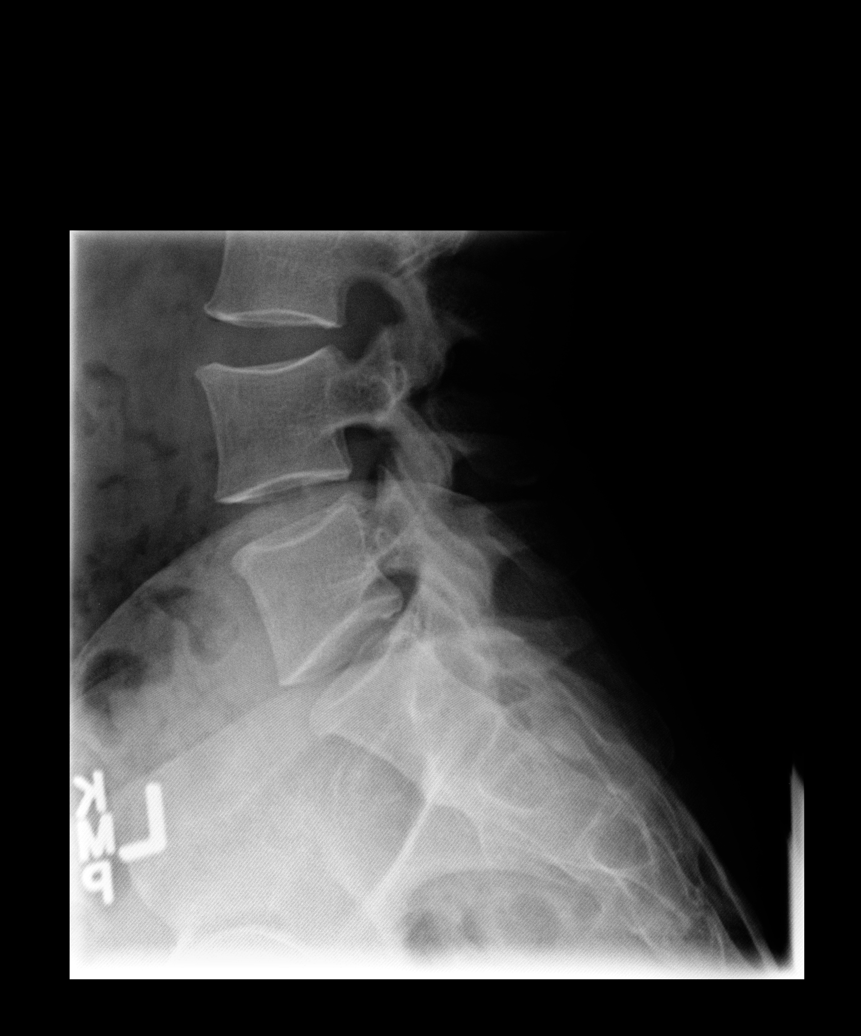

[5 of 5 positions shown; findings below may reference images not displayed]

FINDINGS: There is no evidence of lumbar spine fracture. Alignment is normal.
Intervertebral disc spaces are maintained.
IMPRESSION: Negative.

## 2017-01-18 DIAGNOSIS — G44219 Episodic tension-type headache, not intractable: Secondary | ICD-10-CM | POA: Diagnosis not present

## 2017-01-18 DIAGNOSIS — G43009 Migraine without aura, not intractable, without status migrainosus: Secondary | ICD-10-CM | POA: Diagnosis not present

## 2017-02-14 DIAGNOSIS — Z13220 Encounter for screening for lipoid disorders: Secondary | ICD-10-CM | POA: Diagnosis not present

## 2017-02-14 DIAGNOSIS — Z131 Encounter for screening for diabetes mellitus: Secondary | ICD-10-CM | POA: Diagnosis not present

## 2017-02-14 DIAGNOSIS — Z0000 Encounter for general adult medical examination without abnormal findings: Secondary | ICD-10-CM | POA: Diagnosis not present

## 2017-03-04 DIAGNOSIS — M5136 Other intervertebral disc degeneration, lumbar region: Secondary | ICD-10-CM | POA: Diagnosis not present

## 2017-03-04 DIAGNOSIS — M545 Low back pain: Secondary | ICD-10-CM | POA: Diagnosis not present

## 2017-03-11 DIAGNOSIS — M545 Low back pain: Secondary | ICD-10-CM | POA: Diagnosis not present

## 2017-03-11 DIAGNOSIS — M5186 Other intervertebral disc disorders, lumbar region: Secondary | ICD-10-CM | POA: Diagnosis not present

## 2017-06-14 DIAGNOSIS — F331 Major depressive disorder, recurrent, moderate: Secondary | ICD-10-CM | POA: Diagnosis not present

## 2017-06-17 DIAGNOSIS — Z6825 Body mass index (BMI) 25.0-25.9, adult: Secondary | ICD-10-CM | POA: Diagnosis not present

## 2017-06-17 DIAGNOSIS — Z1151 Encounter for screening for human papillomavirus (HPV): Secondary | ICD-10-CM | POA: Diagnosis not present

## 2017-06-17 DIAGNOSIS — Z01419 Encounter for gynecological examination (general) (routine) without abnormal findings: Secondary | ICD-10-CM | POA: Diagnosis not present

## 2017-06-17 DIAGNOSIS — Z1231 Encounter for screening mammogram for malignant neoplasm of breast: Secondary | ICD-10-CM | POA: Diagnosis not present

## 2017-06-28 DIAGNOSIS — F4320 Adjustment disorder, unspecified: Secondary | ICD-10-CM | POA: Diagnosis not present

## 2017-07-01 DIAGNOSIS — R0981 Nasal congestion: Secondary | ICD-10-CM | POA: Diagnosis not present

## 2017-07-04 DIAGNOSIS — F4320 Adjustment disorder, unspecified: Secondary | ICD-10-CM | POA: Diagnosis not present

## 2017-07-05 DIAGNOSIS — N94810 Vulvar vestibulitis: Secondary | ICD-10-CM | POA: Diagnosis not present

## 2017-07-21 DIAGNOSIS — F4320 Adjustment disorder, unspecified: Secondary | ICD-10-CM | POA: Diagnosis not present

## 2017-07-29 DIAGNOSIS — M9902 Segmental and somatic dysfunction of thoracic region: Secondary | ICD-10-CM | POA: Diagnosis not present

## 2017-07-29 DIAGNOSIS — M9901 Segmental and somatic dysfunction of cervical region: Secondary | ICD-10-CM | POA: Diagnosis not present

## 2017-07-29 DIAGNOSIS — G43109 Migraine with aura, not intractable, without status migrainosus: Secondary | ICD-10-CM | POA: Diagnosis not present

## 2017-07-29 DIAGNOSIS — M9903 Segmental and somatic dysfunction of lumbar region: Secondary | ICD-10-CM | POA: Diagnosis not present

## 2017-07-30 DIAGNOSIS — J0101 Acute recurrent maxillary sinusitis: Secondary | ICD-10-CM | POA: Diagnosis not present

## 2017-08-09 DIAGNOSIS — F4320 Adjustment disorder, unspecified: Secondary | ICD-10-CM | POA: Diagnosis not present

## 2017-08-15 DIAGNOSIS — F331 Major depressive disorder, recurrent, moderate: Secondary | ICD-10-CM | POA: Diagnosis not present

## 2017-08-16 DIAGNOSIS — F4320 Adjustment disorder, unspecified: Secondary | ICD-10-CM | POA: Diagnosis not present

## 2017-09-05 DIAGNOSIS — F4322 Adjustment disorder with anxiety: Secondary | ICD-10-CM | POA: Diagnosis not present

## 2017-09-05 DIAGNOSIS — G4452 New daily persistent headache (NDPH): Secondary | ICD-10-CM | POA: Diagnosis not present

## 2017-09-05 DIAGNOSIS — R0981 Nasal congestion: Secondary | ICD-10-CM | POA: Diagnosis not present

## 2017-09-12 DIAGNOSIS — F4320 Adjustment disorder, unspecified: Secondary | ICD-10-CM | POA: Diagnosis not present

## 2017-09-26 DIAGNOSIS — F419 Anxiety disorder, unspecified: Secondary | ICD-10-CM | POA: Diagnosis not present

## 2017-10-07 DIAGNOSIS — H01001 Unspecified blepharitis right upper eyelid: Secondary | ICD-10-CM | POA: Diagnosis not present

## 2017-10-07 DIAGNOSIS — H04123 Dry eye syndrome of bilateral lacrimal glands: Secondary | ICD-10-CM | POA: Diagnosis not present

## 2017-10-13 DIAGNOSIS — D2262 Melanocytic nevi of left upper limb, including shoulder: Secondary | ICD-10-CM | POA: Diagnosis not present

## 2017-10-13 DIAGNOSIS — L918 Other hypertrophic disorders of the skin: Secondary | ICD-10-CM | POA: Diagnosis not present

## 2017-10-13 DIAGNOSIS — D2261 Melanocytic nevi of right upper limb, including shoulder: Secondary | ICD-10-CM | POA: Diagnosis not present

## 2017-10-13 DIAGNOSIS — D1801 Hemangioma of skin and subcutaneous tissue: Secondary | ICD-10-CM | POA: Diagnosis not present

## 2017-10-20 DIAGNOSIS — F419 Anxiety disorder, unspecified: Secondary | ICD-10-CM | POA: Diagnosis not present

## 2017-10-20 DIAGNOSIS — F4320 Adjustment disorder, unspecified: Secondary | ICD-10-CM | POA: Diagnosis not present

## 2017-11-09 DIAGNOSIS — M25572 Pain in left ankle and joints of left foot: Secondary | ICD-10-CM | POA: Diagnosis not present

## 2017-11-21 DIAGNOSIS — F411 Generalized anxiety disorder: Secondary | ICD-10-CM | POA: Diagnosis not present

## 2018-01-04 DIAGNOSIS — J309 Allergic rhinitis, unspecified: Secondary | ICD-10-CM | POA: Diagnosis not present

## 2018-01-04 DIAGNOSIS — H6980 Other specified disorders of Eustachian tube, unspecified ear: Secondary | ICD-10-CM | POA: Diagnosis not present

## 2018-03-01 DIAGNOSIS — R5383 Other fatigue: Secondary | ICD-10-CM | POA: Diagnosis not present

## 2018-03-01 DIAGNOSIS — Z13220 Encounter for screening for lipoid disorders: Secondary | ICD-10-CM | POA: Diagnosis not present

## 2018-03-01 DIAGNOSIS — Z0000 Encounter for general adult medical examination without abnormal findings: Secondary | ICD-10-CM | POA: Diagnosis not present

## 2018-03-07 ENCOUNTER — Other Ambulatory Visit: Payer: Self-pay | Admitting: Family Medicine

## 2018-03-07 DIAGNOSIS — G4452 New daily persistent headache (NDPH): Secondary | ICD-10-CM

## 2018-03-16 DIAGNOSIS — F4320 Adjustment disorder, unspecified: Secondary | ICD-10-CM | POA: Diagnosis not present

## 2018-03-16 DIAGNOSIS — F419 Anxiety disorder, unspecified: Secondary | ICD-10-CM | POA: Diagnosis not present

## 2018-03-22 ENCOUNTER — Ambulatory Visit
Admission: RE | Admit: 2018-03-22 | Discharge: 2018-03-22 | Disposition: A | Discharge status: Home / Self Care | Payer: BLUE CROSS/BLUE SHIELD | Source: Ambulatory Visit | Attending: Family Medicine | Admitting: Family Medicine

## 2018-03-22 DIAGNOSIS — R51 Headache: Secondary | ICD-10-CM | POA: Diagnosis not present

## 2018-03-22 DIAGNOSIS — F4320 Adjustment disorder, unspecified: Secondary | ICD-10-CM | POA: Diagnosis not present

## 2018-03-22 DIAGNOSIS — F419 Anxiety disorder, unspecified: Secondary | ICD-10-CM | POA: Diagnosis not present

## 2018-03-22 DIAGNOSIS — G4452 New daily persistent headache (NDPH): Secondary | ICD-10-CM

## 2018-03-27 DIAGNOSIS — F4322 Adjustment disorder with anxiety: Secondary | ICD-10-CM | POA: Diagnosis not present

## 2018-04-04 DIAGNOSIS — F4320 Adjustment disorder, unspecified: Secondary | ICD-10-CM | POA: Diagnosis not present

## 2018-04-04 DIAGNOSIS — F419 Anxiety disorder, unspecified: Secondary | ICD-10-CM | POA: Diagnosis not present

## 2018-04-24 ENCOUNTER — Encounter: Payer: Self-pay | Admitting: Allergy and Immunology

## 2018-04-24 ENCOUNTER — Ambulatory Visit (INDEPENDENT_AMBULATORY_CARE_PROVIDER_SITE_OTHER): Payer: BLUE CROSS/BLUE SHIELD | Admitting: Allergy and Immunology

## 2018-04-24 VITALS — BP 98/60 | HR 75 | Temp 97.0°F | Ht 61.8 in | Wt 144.0 lb

## 2018-04-24 DIAGNOSIS — D333 Benign neoplasm of cranial nerves: Secondary | ICD-10-CM | POA: Insufficient documentation

## 2018-04-24 DIAGNOSIS — J3089 Other allergic rhinitis: Secondary | ICD-10-CM | POA: Diagnosis not present

## 2018-04-24 DIAGNOSIS — J322 Chronic ethmoidal sinusitis: Secondary | ICD-10-CM | POA: Diagnosis not present

## 2018-04-24 DIAGNOSIS — J329 Chronic sinusitis, unspecified: Secondary | ICD-10-CM | POA: Insufficient documentation

## 2018-04-24 MED ORDER — FLUTICASONE PROPIONATE 93 MCG/ACT NA EXHU: 2.0000 | INHALANT_SUSPENSION | Freq: Two times a day (BID) | NASAL | 5 refills | Status: DC

## 2018-04-24 MED ORDER — CARBINOXAMINE MALEATE 4 MG PO TABS: ORAL_TABLET | ORAL | 5 refills | Status: DC

## 2018-04-24 NOTE — Progress Notes (Signed)
New Patient Note  RE: Andrea Reyes MRN: [id number] DOB: [birth year] Date of Office Visit: 04/24/2018  Referring provider: Kathyrn Lass, MD Primary care provider: Kathyrn Lass, MD  Chief Complaint: Sinus Problem; Nasal Congestion; and Headache   History of present illness: Andrea Reyes is a 47 y.o. female seen today in consultation requested by Kathyrn Lass, MD.  She reports that over the past 2 years she has experienced frequent nasal congestion, postnasal drainage, and sinus pressure/headaches.  The sinus pressure is mainly located between the eyes, left greater than right.  She does not experience scotoma, nausea, photophobia, or phonophobia.  She does not experience significant sneezing, rhinorrhea, nasal pruritus, or ocular pruritus.  No significant seasonal symptom variation has been noted nor have specific environmental triggers been identified.  She currently takes Claritin and Flonase nasal spray, with the occasional addition of nasal saline lavage and/or pseudoephedrine without adequate symptom relief.  She has a history of acoustic neuroma and mucosal thickening of the ethmoid and maxillary sinuses was noted incidentally on a head CT scan in 2017.  Assessment and plan: Chronic sinusitis  A prescription has been provided for carbinoxamine 4 mg every 6-8 hours if needed.  A prescription has been provided for [facility], 2 actuations per nostril twice a day. Proper technique has been discussed and demonstrated.  Nasal saline lavage (NeilMed) has been recommended as needed and prior to medicated nasal sprays along with instructions for proper administration.  For sinus pressure/pain, may add pseudoephedrine as needed. Pseudoephedrine is only to be used for short-term relief of nasal/sinus congestion. Long-term use is discouraged due to potential side effects.  If this problem persists or progresses, otolaryngology evaluation by Dr. Benjamine Mola is recommended.  Perennial allergic rhinitis  with a probable nonallergic component Epicutaneous tests were negative and intradermal testing revealed borderline positive result to perennial mold mix #4.  Aeroallergen avoidance measures have been discussed and provided in written form.  Treatment plan as outlined above.  Acoustic neuroma [facility])  Follow-up with neurologist as recommended.   Meds ordered this encounter  Medications  . Carbinoxamine Maleate 4 MG TABS    Sig: Take 4 mg tab every 6-8 hours as needed    Dispense:  90 each    Refill:  5  . Fluticasone Propionate (XHANCE) 93 MCG/ACT EXHU    Sig: Place 2 sprays into the nose 2 (two) times daily.    Dispense:  32 mL    Refill:  5    Diagnostics: Epicutaneous testing: Negative despite a positive histamine control. Intradermal testing: Borderline positive to perennial mold mix #4.    Physical examination: Blood pressure 98/60, pulse 75, temperature (!) 97 F (36.1 C), temperature source Oral, height 5' 1.8" (1.57 m), weight 144 lb (65.3 kg), SpO2 97 %.  General: Alert, interactive, in no acute distress. HEENT: TMs pearly gray, turbinates moderately edematous with thick discharge, post-pharynx erythematous. Neck: Supple without lymphadenopathy. Lungs: Clear to auscultation without wheezing, rhonchi or rales. CV: Normal S1, S2 without murmurs. Abdomen: Nondistended, nontender. Skin: Warm and dry, without lesions or rashes. Extremities:  No clubbing, cyanosis or edema. Neuro:   Grossly intact.  Review of systems:  Review of systems negative except as noted in HPI / PMHx or noted below: Review of Systems  Constitutional: Negative.   HENT: Negative.   Eyes: Negative.   Respiratory: Negative.   Cardiovascular: Negative.   Gastrointestinal: Negative.   Genitourinary: Negative.   Musculoskeletal: Negative.   Skin: Negative.   Neurological:  Negative.   Endo/Heme/Allergies: Negative.   Psychiatric/Behavioral: Negative.     Past medical history:  History  reviewed. No pertinent past medical history.  Past surgical history:  History reviewed. No pertinent surgical history.  Family history: Family History  Problem Relation Age of Onset  . Allergic rhinitis Neg Hx   . Asthma Neg Hx   . Eczema Neg Hx   . Urticaria Neg Hx     Social history: Social History   Socioeconomic History  . Marital status: Married    Spouse name: Not on file  . Number of children: Not on file  . Years of education: Not on file  . Highest education level: Not on file  Occupational History  . Not on file  Social Needs  . Financial resource strain: Not on file  . Food insecurity:    Worry: Not on file    Inability: Not on file  . Transportation needs:    Medical: Not on file    Non-medical: Not on file  Tobacco Use  . Smoking status: Never Smoker  . Smokeless tobacco: Never Used  Substance and Sexual Activity  . Alcohol use: Never    Frequency: Never  . Drug use: Never  . Sexual activity: Not on file  Lifestyle  . Physical activity:    Days per week: Not on file    Minutes per session: Not on file  . Stress: Not on file  Relationships  . Social connections:    Talks on phone: Not on file    Gets together: Not on file    Attends religious service: Not on file    Active member of club or organization: Not on file    Attends meetings of clubs or organizations: Not on file    Relationship status: Not on file  . Intimate partner violence:    Fear of current or ex partner: Not on file    Emotionally abused: Not on file    Physically abused: Not on file    Forced sexual activity: Not on file  Other Topics Concern  . Not on file  Social History Narrative  . Not on file   Environmental History: The patient lives in a 48 year old house with hardwood floors throughout, gas heat, and central air.  There is no known mold/water damage in the home.  There is a dog in the home which has access to her bedroom.  She is a non-smoker.  Allergies as of  04/24/2018      Reactions   Erythromycin Rash      Medication List        Accurate as of 04/24/18  5:07 PM. Always use your most recent med list.          butalbital-acetaminophen-caffeine 50-325-40 MG tablet Commonly known as:  FIORICET, ESGIC   CAMRESE 0.15-0.03 &0.01 MG tablet Generic drug:  Levonorgestrel-Ethinyl Estradiol   Carbinoxamine Maleate 4 MG Tabs Take 4 mg tab every 6-8 hours as needed   cyclobenzaprine 10 MG tablet Commonly known as:  FLEXERIL   Fluticasone Propionate 93 MCG/ACT Exhu Place 2 sprays into the nose 2 (two) times daily.   sertraline 100 MG tablet Commonly known as:  ZOLOFT Take 100 mg by mouth daily.       Known medication allergies: Allergies  Allergen Reactions  . Erythromycin Rash    I appreciate the opportunity to take part in Allyson's care. Please do not hesitate to contact me with questions.  Sincerely,   R.  Edgar Frisk, MD

## 2018-04-24 NOTE — Assessment & Plan Note (Addendum)
   A prescription has been provided for carbinoxamine 4 mg every 6-8 hours if needed.  A prescription has been provided for [facility], 2 actuations per nostril twice a day. Proper technique has been discussed and demonstrated.  Nasal saline lavage (NeilMed) has been recommended as needed and prior to medicated nasal sprays along with instructions for proper administration.  For sinus pressure/pain, may add pseudoephedrine as needed. Pseudoephedrine is only to be used for short-term relief of nasal/sinus congestion. Long-term use is discouraged due to potential side effects.  If this problem persists or progresses, otolaryngology evaluation by Dr. Benjamine Mola is recommended.

## 2018-04-24 NOTE — Assessment & Plan Note (Signed)
   Follow-up with neurologist as recommended.

## 2018-04-24 NOTE — Assessment & Plan Note (Signed)
Epicutaneous tests were negative and intradermal testing revealed borderline positive result to perennial mold mix #4.  Aeroallergen avoidance measures have been discussed and provided in written form.  Treatment plan as outlined above.

## 2018-04-24 NOTE — Patient Instructions (Addendum)
Chronic sinusitis  A prescription has been provided for carbinoxamine 4 mg every 6-8 hours if needed.  A prescription has been provided for [facility], 2 actuations per nostril twice a day. Proper technique has been discussed and demonstrated.  Nasal saline lavage (NeilMed) has been recommended as needed and prior to medicated nasal sprays along with instructions for proper administration.  For sinus pressure/pain, may add pseudoephedrine as needed. Pseudoephedrine is only to be used for short-term relief of nasal/sinus congestion. Long-term use is discouraged due to potential side effects.  If this problem persists or progresses, otolaryngology evaluation by Dr. Benjamine Mola is recommended.  Perennial allergic rhinitis with a probable nonallergic component Epicutaneous tests were negative and intradermal testing revealed borderline positive result to perennial mold mix #4.  Aeroallergen avoidance measures have been discussed and provided in written form.  Treatment plan as outlined above.  Acoustic neuroma [facility])  Follow-up with neurologist as recommended.   Return if symptoms worsen or fail to improve.  Control of Mold Allergen  Mold and fungi can grow on a variety of surfaces provided certain temperature and moisture conditions exist.  Outdoor molds grow on plants, decaying vegetation and soil.  The major outdoor mold, Alternaria and Cladosporium, are found in very high numbers during hot and dry conditions.  Generally, a late Summer - Fall peak is seen for common outdoor fungal spores.  Rain will temporarily lower outdoor mold spore count, but counts rise rapidly when the rainy period ends.  The most important indoor molds are Aspergillus and Penicillium.  Dark, humid and poorly ventilated basements are ideal sites for mold growth.  The next most common sites of mold growth are the bathroom and the kitchen.  Outdoor [employer] 1. Use air conditioning and keep windows closed 2. Avoid exposure  to decaying vegetation. 3. Avoid leaf raking. 4. Avoid grain handling. 5. Consider wearing a face mask if working in moldy areas.  Indoor Mold Control 1. Maintain humidity below 50%. 2. Clean washable surfaces with 5% bleach solution. 3. Remove sources e.g. Contaminated carpets.

## 2018-04-28 DIAGNOSIS — Z23 Encounter for immunization: Secondary | ICD-10-CM | POA: Diagnosis not present

## 2018-06-21 ENCOUNTER — Telehealth: Payer: Self-pay | Admitting: Allergy and Immunology

## 2018-06-21 NOTE — Telephone Encounter (Signed)
It was mentioned to the patient at a previous visit that she may need a referral to ENT ( Dr Benjamine Mola ) Patient would like to proceed with this referral

## 2018-06-21 NOTE — Telephone Encounter (Signed)
Dr  Verlin Fester please advise to proceed with referral

## 2018-06-21 NOTE — Telephone Encounter (Signed)
Ok, please arrange the referral to Dr. Benjamine Mola for chronic sinusitis. Thanks.

## 2018-06-26 NOTE — Telephone Encounter (Signed)
noted 

## 2018-06-26 NOTE — Telephone Encounter (Signed)
Referral placed to Dr Velvet Bathe office.

## 2018-07-14 DIAGNOSIS — F4320 Adjustment disorder, unspecified: Secondary | ICD-10-CM | POA: Diagnosis not present

## 2018-07-31 DIAGNOSIS — N898 Other specified noninflammatory disorders of vagina: Secondary | ICD-10-CM | POA: Diagnosis not present

## 2018-08-15 DIAGNOSIS — J343 Hypertrophy of nasal turbinates: Secondary | ICD-10-CM | POA: Diagnosis not present

## 2018-08-15 DIAGNOSIS — J310 Chronic rhinitis: Secondary | ICD-10-CM | POA: Diagnosis not present

## 2018-08-15 DIAGNOSIS — H903 Sensorineural hearing loss, bilateral: Secondary | ICD-10-CM | POA: Diagnosis not present

## 2018-08-15 DIAGNOSIS — J342 Deviated nasal septum: Secondary | ICD-10-CM | POA: Diagnosis not present

## 2018-09-02 HISTORY — PX: SEPTOPLASTY: SUR1290

## 2018-09-05 DIAGNOSIS — Z6824 Body mass index (BMI) 24.0-24.9, adult: Secondary | ICD-10-CM | POA: Diagnosis not present

## 2018-09-05 DIAGNOSIS — Z1231 Encounter for screening mammogram for malignant neoplasm of breast: Secondary | ICD-10-CM | POA: Diagnosis not present

## 2018-09-05 DIAGNOSIS — Z01419 Encounter for gynecological examination (general) (routine) without abnormal findings: Secondary | ICD-10-CM | POA: Diagnosis not present

## 2018-09-15 DIAGNOSIS — J3489 Other specified disorders of nose and nasal sinuses: Secondary | ICD-10-CM | POA: Diagnosis not present

## 2018-09-15 DIAGNOSIS — J343 Hypertrophy of nasal turbinates: Secondary | ICD-10-CM | POA: Diagnosis not present

## 2018-09-15 DIAGNOSIS — J342 Deviated nasal septum: Secondary | ICD-10-CM | POA: Diagnosis not present

## 2018-11-30 DIAGNOSIS — D1801 Hemangioma of skin and subcutaneous tissue: Secondary | ICD-10-CM | POA: Diagnosis not present

## 2018-11-30 DIAGNOSIS — D2371 Other benign neoplasm of skin of right lower limb, including hip: Secondary | ICD-10-CM | POA: Diagnosis not present

## 2018-11-30 DIAGNOSIS — D2272 Melanocytic nevi of left lower limb, including hip: Secondary | ICD-10-CM | POA: Diagnosis not present

## 2018-11-30 DIAGNOSIS — L814 Other melanin hyperpigmentation: Secondary | ICD-10-CM | POA: Diagnosis not present

## 2018-12-21 DIAGNOSIS — F411 Generalized anxiety disorder: Secondary | ICD-10-CM | POA: Diagnosis not present

## 2019-02-05 IMAGING — CT CT MAXILLOFACIAL W/O CM
3 of 5 series · 13 of 47 positions shown, 15 images · non-contrast
Comparison: None.

CLINICAL DATA: 47-year-old female with new persistent daily
headaches. History of post tubular schwannoma.

EXAM:
CT MAXILLOFACIAL WITHOUT CONTRAST
TECHNIQUE: Multidetector CT images of the paranasal sinuses were obtained using
the standard protocol without intravenous contrast.

[Series 2: sinus 2.00 hr60 s3 ax · axial · 0.34mm/px · z∈[-653,-565]mm · 7 of 58 slices shown, 9 images]
[im 7/58  brain]
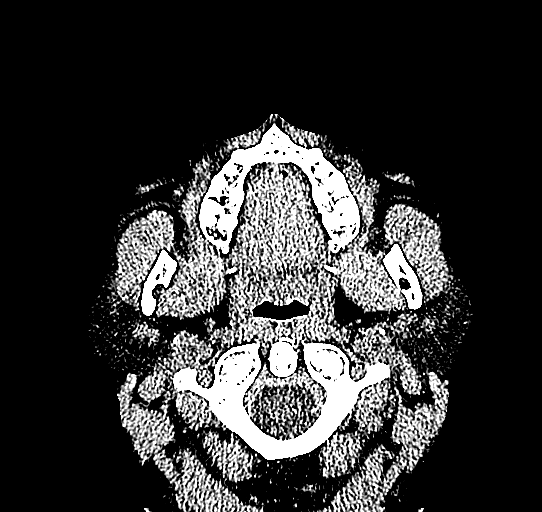
[im 7/58  bone]
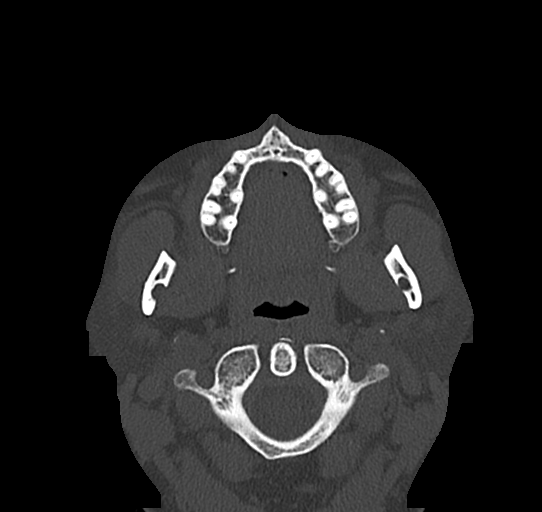
[im 13/58  bone]
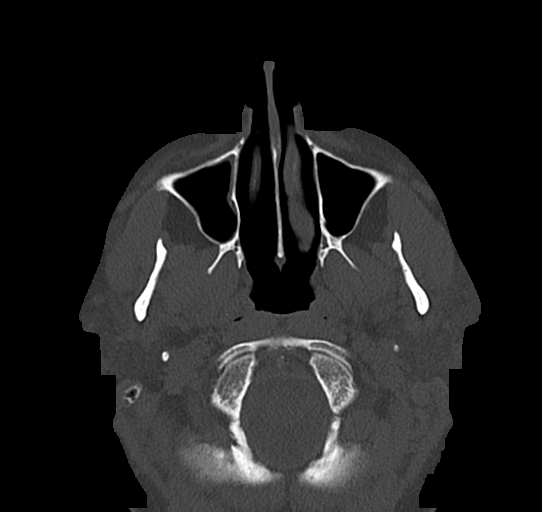
[im 20/58  bone]
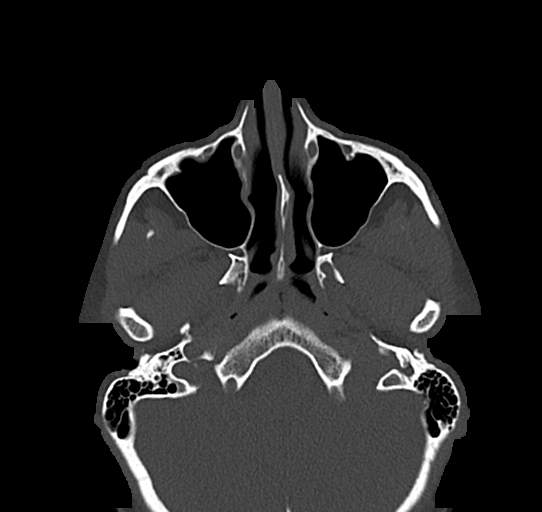
[im 32/58  bone]
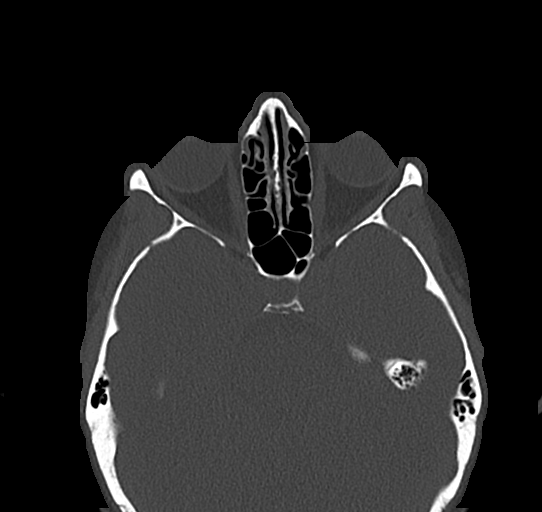
[im 39/58  brain]
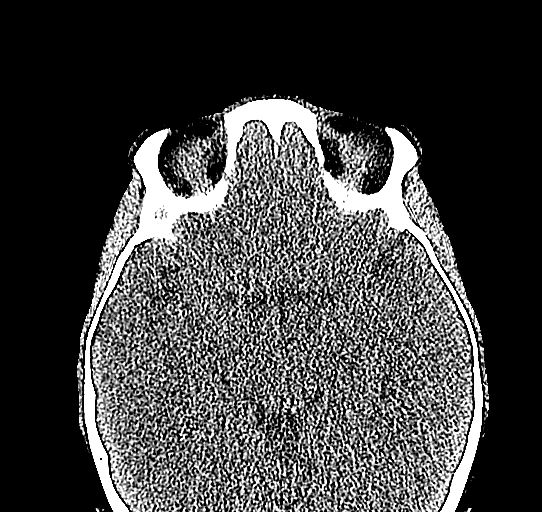
[im 39/58  bone]
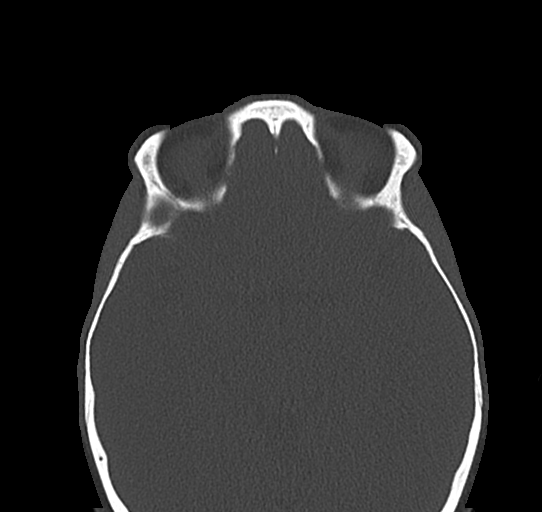
[im 45/58  bone]
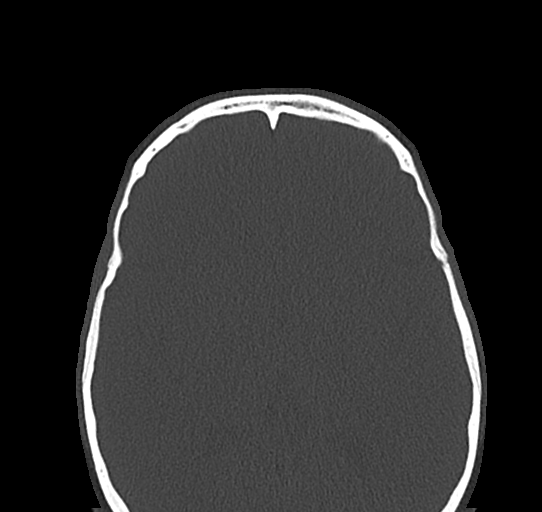
[im 51/58  bone]
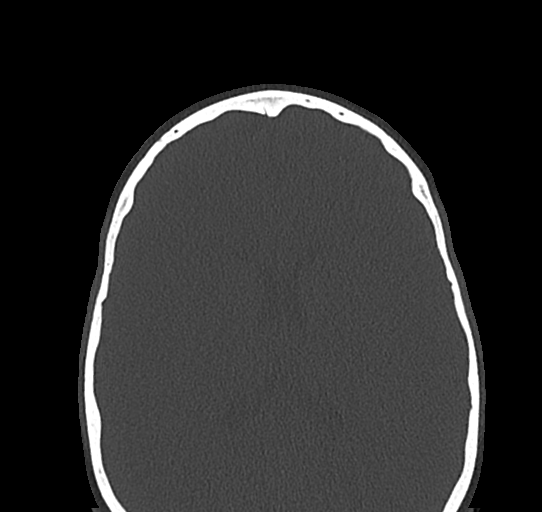

[Series 4: sinus 2.00 hr60 s3 cor · coronal · 0.23mm/px · 3 of 87 slices shown]
[im 29/87  bone]
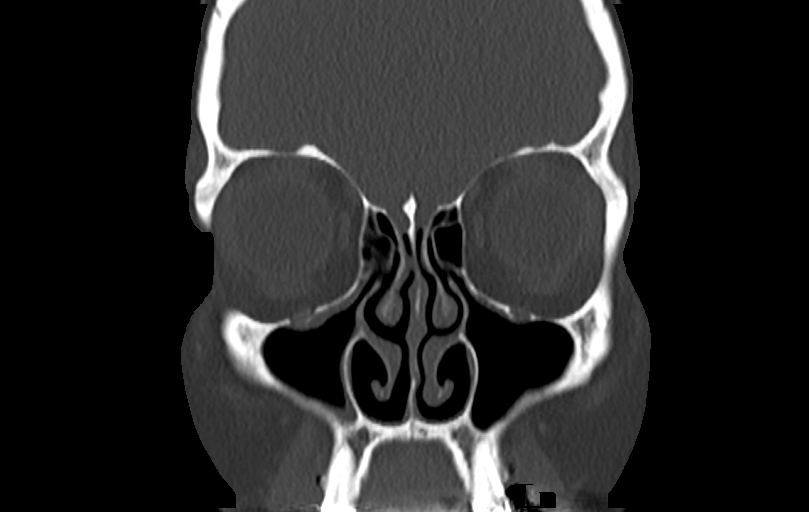
[im 39/87  bone]
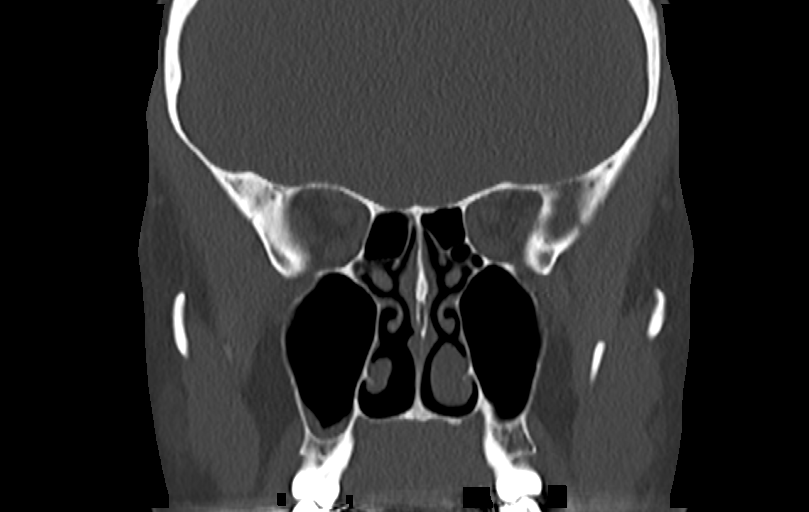
[im 48/87  bone]
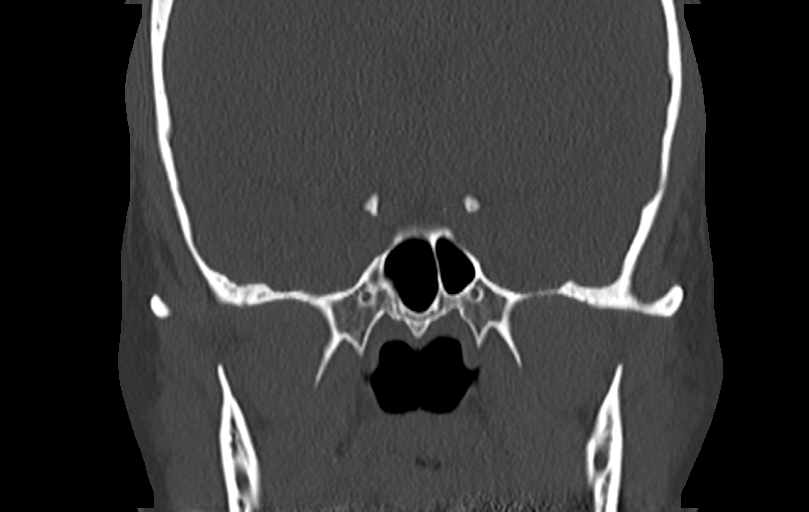

[Series 6: sinus 2.00 hr60 s3 sag · sagittal · 0.23mm/px · 3 of 92 slices shown]
[im 31/92  bone]
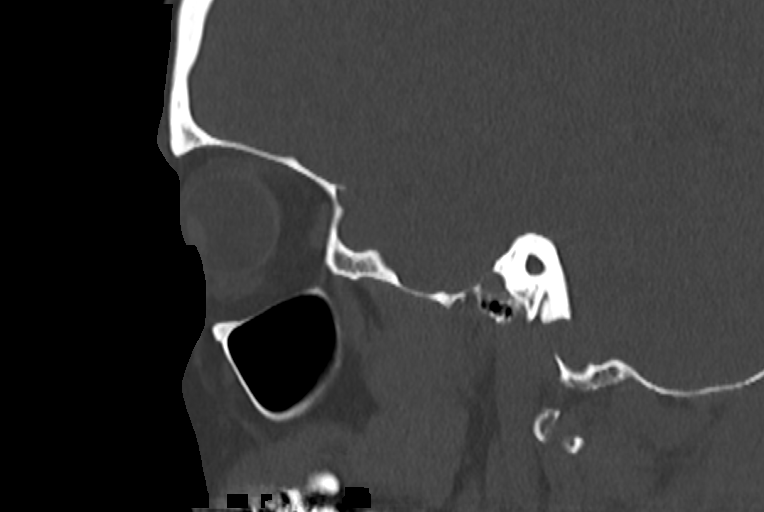
[im 46/92  bone]
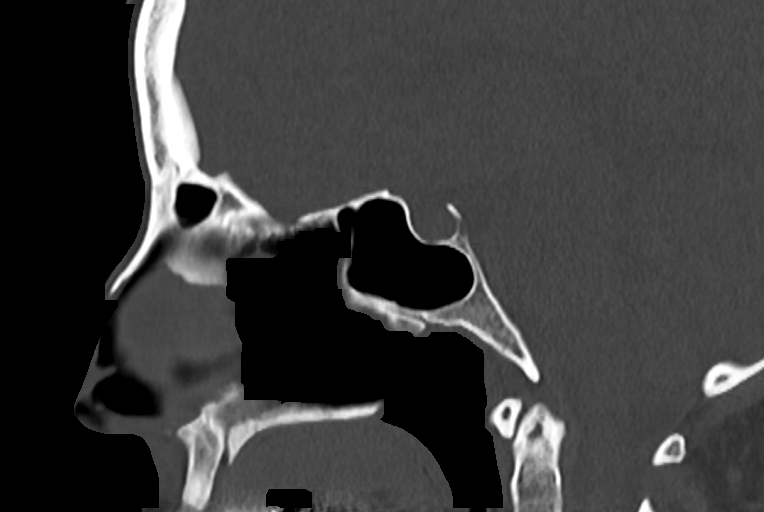
[im 61/92  bone]
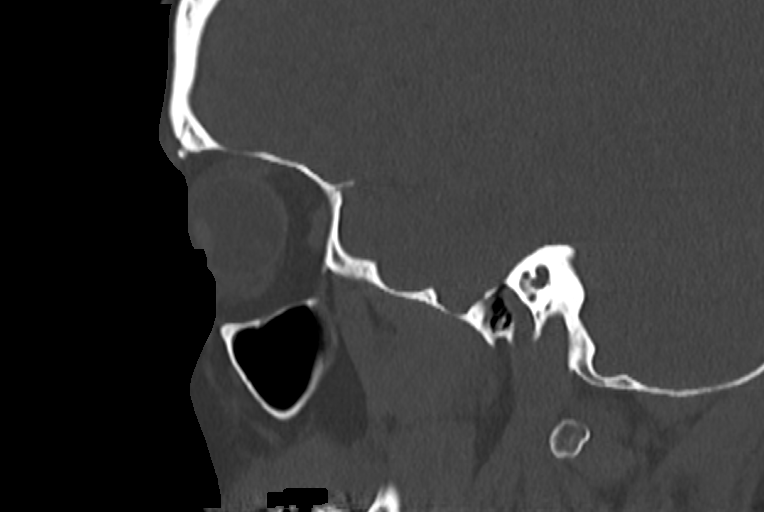

[13 of 47 positions shown; findings below may reference images not displayed]

FINDINGS: Paranasal sinuses:

Frontal: Hypoplastic. Normally aerated on the left. Normally aerated
on the right aside from minimal right frontoethmoidal recess mucosal
thickening.

Ethmoid: Normally aerated aside from minimal anterior ethmoid
mucosal thickening.

Maxillary: The left maxillary sinus is clear. There is mild
peripheral mucosal thickening in the right maxillary sinus. No sinus
fluid level or bubbly opacity.

Sphenoid: Normally aerated. Patent sphenoethmoidal recesses.

Right ostiomeatal unit: Patent despite mucosal thickening (coronal
image 29).

Left ostiomeatal unit: Patent (same image).

Nasal passages: Nasal septum is intact. There is mild leftward
posterior septal deviation and spurring. Mildly atrophic appearing
nasal cavity mucosa. Nasal cavity is normally aerated, including the
olfactory recesses.

Anatomy:

Anterior ethmoidal artery position suspected on coronal image 33.

Keros type 2 olfactory fossa.

Other: Negative visible noncontrast brain parenchyma.

Visualized orbit soft tissues are within normal limits. Visualized
scalp soft tissues are within normal limits.

Negative visible noncontrast deep soft tissue spaces of the face.

The bilateral tympanic cavities and mastoids are clear. Normal CT
appearance of the bilateral internal auditory canals,
cerebellopontine angles.

No craniotomy changes identified. Negative visible maxillary
dentition. No acute osseous abnormality identified.
IMPRESSION: 1. No evidence of acute sinusitis. Well pneumatized paranasal
sinuses aside from mild right maxillary and trace anterior ethmoid
mucosal thickening.
2. Bilateral tympanic cavities and mastoids are clear. Normal
noncontrast CT appearance of the cerebellopontine angles and IAC.

## 2019-05-10 ENCOUNTER — Other Ambulatory Visit: Payer: Self-pay

## 2019-05-10 DIAGNOSIS — Z20822 Contact with and (suspected) exposure to covid-19: Secondary | ICD-10-CM

## 2019-05-10 DIAGNOSIS — Z20828 Contact with and (suspected) exposure to other viral communicable diseases: Secondary | ICD-10-CM | POA: Diagnosis not present

## 2019-05-12 LAB — NOVEL CORONAVIRUS, NAA: SARS-CoV-2, NAA: NOT DETECTED

## 2019-06-12 DIAGNOSIS — D333 Benign neoplasm of cranial nerves: Secondary | ICD-10-CM | POA: Diagnosis not present

## 2019-08-21 DIAGNOSIS — F419 Anxiety disorder, unspecified: Secondary | ICD-10-CM | POA: Diagnosis not present

## 2019-08-28 DIAGNOSIS — F419 Anxiety disorder, unspecified: Secondary | ICD-10-CM | POA: Diagnosis not present

## 2019-08-31 ENCOUNTER — Ambulatory Visit: Payer: BC Managed Care – PPO | Attending: Internal Medicine

## 2019-08-31 ENCOUNTER — Other Ambulatory Visit: Payer: BC Managed Care – PPO

## 2019-08-31 DIAGNOSIS — Z20822 Contact with and (suspected) exposure to covid-19: Secondary | ICD-10-CM

## 2019-09-01 LAB — NOVEL CORONAVIRUS, NAA: SARS-CoV-2, NAA: NOT DETECTED

## 2019-09-12 ENCOUNTER — Ambulatory Visit: Payer: BC Managed Care – PPO | Attending: Internal Medicine

## 2019-09-12 DIAGNOSIS — Z20822 Contact with and (suspected) exposure to covid-19: Secondary | ICD-10-CM | POA: Diagnosis not present

## 2019-09-13 LAB — NOVEL CORONAVIRUS, NAA: SARS-CoV-2, NAA: NOT DETECTED

## 2019-09-17 ENCOUNTER — Ambulatory Visit: Payer: BC Managed Care – PPO | Attending: Internal Medicine

## 2019-09-17 DIAGNOSIS — Z20822 Contact with and (suspected) exposure to covid-19: Secondary | ICD-10-CM | POA: Diagnosis not present

## 2019-09-18 LAB — NOVEL CORONAVIRUS, NAA: SARS-CoV-2, NAA: NOT DETECTED

## 2019-09-24 ENCOUNTER — Ambulatory Visit: Payer: BC Managed Care – PPO | Attending: Internal Medicine

## 2019-09-24 DIAGNOSIS — Z20822 Contact with and (suspected) exposure to covid-19: Secondary | ICD-10-CM | POA: Diagnosis not present

## 2019-09-25 LAB — NOVEL CORONAVIRUS, NAA: SARS-CoV-2, NAA: NOT DETECTED

## 2019-10-26 DIAGNOSIS — J343 Hypertrophy of nasal turbinates: Secondary | ICD-10-CM | POA: Diagnosis not present

## 2019-10-26 DIAGNOSIS — H903 Sensorineural hearing loss, bilateral: Secondary | ICD-10-CM | POA: Diagnosis not present

## 2019-10-26 DIAGNOSIS — D333 Benign neoplasm of cranial nerves: Secondary | ICD-10-CM | POA: Diagnosis not present

## 2019-10-26 DIAGNOSIS — J310 Chronic rhinitis: Secondary | ICD-10-CM | POA: Diagnosis not present

## 2019-11-15 DIAGNOSIS — F419 Anxiety disorder, unspecified: Secondary | ICD-10-CM | POA: Diagnosis not present

## 2019-11-27 DIAGNOSIS — F419 Anxiety disorder, unspecified: Secondary | ICD-10-CM | POA: Diagnosis not present

## 2019-12-04 DIAGNOSIS — F419 Anxiety disorder, unspecified: Secondary | ICD-10-CM | POA: Diagnosis not present

## 2020-01-22 DIAGNOSIS — D2239 Melanocytic nevi of other parts of face: Secondary | ICD-10-CM | POA: Diagnosis not present

## 2020-01-22 DIAGNOSIS — D2261 Melanocytic nevi of right upper limb, including shoulder: Secondary | ICD-10-CM | POA: Diagnosis not present

## 2020-01-22 DIAGNOSIS — D2262 Melanocytic nevi of left upper limb, including shoulder: Secondary | ICD-10-CM | POA: Diagnosis not present

## 2020-01-22 DIAGNOSIS — L814 Other melanin hyperpigmentation: Secondary | ICD-10-CM | POA: Diagnosis not present

## 2020-02-13 DIAGNOSIS — F419 Anxiety disorder, unspecified: Secondary | ICD-10-CM | POA: Diagnosis not present

## 2020-02-14 DIAGNOSIS — F419 Anxiety disorder, unspecified: Secondary | ICD-10-CM | POA: Diagnosis not present

## 2020-02-14 DIAGNOSIS — N94819 Vulvodynia, unspecified: Secondary | ICD-10-CM | POA: Diagnosis not present

## 2020-02-14 DIAGNOSIS — Z131 Encounter for screening for diabetes mellitus: Secondary | ICD-10-CM | POA: Diagnosis not present

## 2020-02-14 DIAGNOSIS — Z130 Encounter for screening for diseases of the blood and blood-forming organs and certain disorders involving the immune mechanism: Secondary | ICD-10-CM | POA: Diagnosis not present

## 2020-02-14 DIAGNOSIS — Z1329 Encounter for screening for other suspected endocrine disorder: Secondary | ICD-10-CM | POA: Diagnosis not present

## 2020-02-14 DIAGNOSIS — Z6825 Body mass index (BMI) 25.0-25.9, adult: Secondary | ICD-10-CM | POA: Diagnosis not present

## 2020-02-14 DIAGNOSIS — Z1231 Encounter for screening mammogram for malignant neoplasm of breast: Secondary | ICD-10-CM | POA: Diagnosis not present

## 2020-02-14 DIAGNOSIS — Z0000 Encounter for general adult medical examination without abnormal findings: Secondary | ICD-10-CM | POA: Diagnosis not present

## 2020-02-14 DIAGNOSIS — Z1151 Encounter for screening for human papillomavirus (HPV): Secondary | ICD-10-CM | POA: Diagnosis not present

## 2020-02-14 DIAGNOSIS — Z13220 Encounter for screening for lipoid disorders: Secondary | ICD-10-CM | POA: Diagnosis not present

## 2020-02-14 DIAGNOSIS — D649 Anemia, unspecified: Secondary | ICD-10-CM | POA: Diagnosis not present

## 2020-02-14 DIAGNOSIS — Z01419 Encounter for gynecological examination (general) (routine) without abnormal findings: Secondary | ICD-10-CM | POA: Diagnosis not present

## 2020-02-15 ENCOUNTER — Other Ambulatory Visit: Payer: Self-pay | Admitting: Obstetrics and Gynecology

## 2020-02-15 DIAGNOSIS — Z780 Asymptomatic menopausal state: Secondary | ICD-10-CM

## 2020-02-20 DIAGNOSIS — F419 Anxiety disorder, unspecified: Secondary | ICD-10-CM | POA: Diagnosis not present

## 2020-03-05 DIAGNOSIS — F419 Anxiety disorder, unspecified: Secondary | ICD-10-CM | POA: Diagnosis not present

## 2020-03-11 DIAGNOSIS — F419 Anxiety disorder, unspecified: Secondary | ICD-10-CM | POA: Diagnosis not present

## 2020-05-03 DIAGNOSIS — Z20822 Contact with and (suspected) exposure to covid-19: Secondary | ICD-10-CM | POA: Diagnosis not present

## 2020-05-06 DIAGNOSIS — F4320 Adjustment disorder, unspecified: Secondary | ICD-10-CM | POA: Diagnosis not present

## 2020-05-06 DIAGNOSIS — F419 Anxiety disorder, unspecified: Secondary | ICD-10-CM | POA: Diagnosis not present

## 2020-05-13 ENCOUNTER — Other Ambulatory Visit: Payer: BC Managed Care – PPO

## 2020-06-16 ENCOUNTER — Other Ambulatory Visit: Payer: Self-pay

## 2020-06-16 ENCOUNTER — Ambulatory Visit
Admission: RE | Admit: 2020-06-16 | Discharge: 2020-06-16 | Disposition: A | Discharge status: Home / Self Care | Payer: BC Managed Care – PPO | Source: Ambulatory Visit | Attending: Obstetrics and Gynecology | Admitting: Obstetrics and Gynecology

## 2020-06-16 DIAGNOSIS — Z780 Asymptomatic menopausal state: Secondary | ICD-10-CM

## 2020-07-02 DIAGNOSIS — F419 Anxiety disorder, unspecified: Secondary | ICD-10-CM | POA: Diagnosis not present

## 2020-07-14 DIAGNOSIS — F419 Anxiety disorder, unspecified: Secondary | ICD-10-CM | POA: Diagnosis not present

## 2020-07-24 DIAGNOSIS — Z20822 Contact with and (suspected) exposure to covid-19: Secondary | ICD-10-CM | POA: Diagnosis not present

## 2020-08-04 DIAGNOSIS — F419 Anxiety disorder, unspecified: Secondary | ICD-10-CM | POA: Diagnosis not present

## 2020-08-21 DIAGNOSIS — F419 Anxiety disorder, unspecified: Secondary | ICD-10-CM | POA: Diagnosis not present

## 2020-09-01 DIAGNOSIS — F419 Anxiety disorder, unspecified: Secondary | ICD-10-CM | POA: Diagnosis not present

## 2020-09-15 DIAGNOSIS — F419 Anxiety disorder, unspecified: Secondary | ICD-10-CM | POA: Diagnosis not present

## 2020-09-24 DIAGNOSIS — F419 Anxiety disorder, unspecified: Secondary | ICD-10-CM | POA: Diagnosis not present

## 2020-10-14 DIAGNOSIS — F419 Anxiety disorder, unspecified: Secondary | ICD-10-CM | POA: Diagnosis not present

## 2020-10-22 DIAGNOSIS — F419 Anxiety disorder, unspecified: Secondary | ICD-10-CM | POA: Diagnosis not present

## 2020-11-05 DIAGNOSIS — C4401 Basal cell carcinoma of skin of lip: Secondary | ICD-10-CM | POA: Diagnosis not present

## 2020-11-05 DIAGNOSIS — C44319 Basal cell carcinoma of skin of other parts of face: Secondary | ICD-10-CM | POA: Diagnosis not present

## 2020-11-06 ENCOUNTER — Encounter: Payer: Self-pay | Admitting: Gastroenterology

## 2020-11-28 DIAGNOSIS — C44319 Basal cell carcinoma of skin of other parts of face: Secondary | ICD-10-CM | POA: Diagnosis not present

## 2020-11-28 DIAGNOSIS — I788 Other diseases of capillaries: Secondary | ICD-10-CM | POA: Diagnosis not present

## 2020-11-28 DIAGNOSIS — D2239 Melanocytic nevi of other parts of face: Secondary | ICD-10-CM | POA: Diagnosis not present

## 2020-11-28 DIAGNOSIS — L738 Other specified follicular disorders: Secondary | ICD-10-CM | POA: Diagnosis not present

## 2020-11-28 DIAGNOSIS — Z85828 Personal history of other malignant neoplasm of skin: Secondary | ICD-10-CM | POA: Diagnosis not present

## 2020-12-04 DIAGNOSIS — C4401 Basal cell carcinoma of skin of lip: Secondary | ICD-10-CM | POA: Diagnosis not present

## 2020-12-04 DIAGNOSIS — C44319 Basal cell carcinoma of skin of other parts of face: Secondary | ICD-10-CM | POA: Diagnosis not present

## 2021-01-07 DIAGNOSIS — D649 Anemia, unspecified: Secondary | ICD-10-CM | POA: Diagnosis not present

## 2021-01-07 DIAGNOSIS — Z0000 Encounter for general adult medical examination without abnormal findings: Secondary | ICD-10-CM | POA: Diagnosis not present

## 2021-01-07 DIAGNOSIS — R946 Abnormal results of thyroid function studies: Secondary | ICD-10-CM | POA: Diagnosis not present

## 2021-01-07 DIAGNOSIS — Z23 Encounter for immunization: Secondary | ICD-10-CM | POA: Diagnosis not present

## 2021-01-07 DIAGNOSIS — Z13220 Encounter for screening for lipoid disorders: Secondary | ICD-10-CM | POA: Diagnosis not present

## 2021-01-08 ENCOUNTER — Ambulatory Visit (AMBULATORY_SURGERY_CENTER): Payer: BC Managed Care – PPO | Admitting: *Deleted

## 2021-01-08 ENCOUNTER — Other Ambulatory Visit: Payer: Self-pay

## 2021-01-08 VITALS — Ht 62.0 in | Wt 139.0 lb

## 2021-01-08 DIAGNOSIS — Z1211 Encounter for screening for malignant neoplasm of colon: Secondary | ICD-10-CM

## 2021-01-08 NOTE — Progress Notes (Signed)

## 2021-01-13 DIAGNOSIS — F419 Anxiety disorder, unspecified: Secondary | ICD-10-CM | POA: Diagnosis not present

## 2021-01-20 DIAGNOSIS — F419 Anxiety disorder, unspecified: Secondary | ICD-10-CM | POA: Diagnosis not present

## 2021-01-20 DIAGNOSIS — F4320 Adjustment disorder, unspecified: Secondary | ICD-10-CM | POA: Diagnosis not present

## 2021-01-22 ENCOUNTER — Encounter: Payer: BC Managed Care – PPO | Admitting: Gastroenterology

## 2021-01-28 DIAGNOSIS — F419 Anxiety disorder, unspecified: Secondary | ICD-10-CM | POA: Diagnosis not present

## 2021-01-29 ENCOUNTER — Ambulatory Visit (AMBULATORY_SURGERY_CENTER): Payer: BC Managed Care – PPO | Admitting: Gastroenterology

## 2021-01-29 ENCOUNTER — Other Ambulatory Visit: Payer: Self-pay

## 2021-01-29 ENCOUNTER — Encounter: Payer: Self-pay | Admitting: Gastroenterology

## 2021-01-29 VITALS — BP 129/62 | HR 63 | Temp 97.6°F | Resp 26 | Ht 61.5 in | Wt 139.0 lb

## 2021-01-29 DIAGNOSIS — Z1211 Encounter for screening for malignant neoplasm of colon: Secondary | ICD-10-CM | POA: Diagnosis not present

## 2021-01-29 MED ORDER — SODIUM CHLORIDE 0.9 % IV SOLN: 500.0000 mL | Freq: Once | INTRAVENOUS | Status: DC

## 2021-01-29 NOTE — Patient Instructions (Signed)
Thank you for letting us take care of your healthcare needs today.     YOU HAD AN ENDOSCOPIC PROCEDURE TODAY AT [facility]:   Refer to the procedure report that was given to you for any specific questions about what was found during the examination.  If the procedure report does not answer your questions, please call your gastroenterologist to clarify.  If you requested that your care partner not be given the details of your procedure findings, then the procedure report has been included in a sealed envelope for you to review at your convenience later.  YOU SHOULD EXPECT: Some feelings of bloating in the abdomen. Passage of more gas than usual.  Walking can help get rid of the air that was put into your GI tract during the procedure and reduce the bloating. If you had a lower endoscopy (such as a colonoscopy or flexible sigmoidoscopy) you may notice spotting of blood in your stool or on the toilet paper. If you underwent a bowel prep for your procedure, you may not have a normal bowel movement for a few days.  Please Note:  You might notice some irritation and congestion in your nose or some drainage.  This is from the oxygen used during your procedure.  There is no need for concern and it should clear up in a day or so.  SYMPTOMS TO REPORT IMMEDIATELY:  Following lower endoscopy (colonoscopy or flexible sigmoidoscopy):  Excessive amounts of blood in the stool  Significant tenderness or worsening of abdominal pains  Swelling of the abdomen that is new, acute  Fever of 100F or higher    For urgent or emergent issues, a gastroenterologist can be reached at any hour by calling [phone]. Do not use MyChart messaging for urgent concerns.    DIET:  We do recommend a small meal at first, but then you may proceed to your regular diet.  Drink plenty of fluids but you should avoid alcoholic beverages for 24 hours.  ACTIVITY:  You should plan to take it easy for the rest of  today and you should NOT DRIVE or use heavy machinery until tomorrow (because of the sedation medicines used during the test).    FOLLOW UP: Our staff will call the number listed on your records 48-72 hours following your procedure to check on you and address any questions or concerns that you may have regarding the information given to you following your procedure. If we do not reach you, we will leave a message.  We will attempt to reach you two times.  During this call, we will ask if you have developed any symptoms of COVID 19. If you develop any symptoms (ie: fever, flu-like symptoms, shortness of breath, cough etc.) before then, please call [phone].  If you test positive for Covid 19 in the 2 weeks post procedure, please call and report this information to [place].    If any biopsies were taken you will be contacted by phone or by letter within the next 1-3 weeks.  Please call us at [phone] if you have not heard about the biopsies in 3 weeks.    SIGNATURES/CONFIDENTIALITY: You and/or your care partner have signed paperwork which will be entered into your electronic medical record.  These signatures attest to the fact that that the information above on your After Visit Summary has been reviewed and is understood.  Full responsibility of the confidentiality of this discharge information lies with you and/or your care-partner.

## 2021-01-29 NOTE — Op Note (Signed)
[place] Patient Name: Andrea Reyes Procedure Date: 01/29/2021 11:52 AM MRN: [id number] Endoscopist: Thornton Park MD, MD Age: 50 Referring MD:  Date of Birth: 1971-02-04 Gender: Female Account #: [id number] Procedure:                Colonoscopy Indications:              Screening for colorectal malignant neoplasm, This                            is the patient's first colonoscopy                           No known family history of colon cancer or polyps Medicines:                Monitored Anesthesia Care Procedure:                Pre-Anesthesia Assessment:                           - Prior to the procedure, a History and Physical                            was performed, and patient medications and                            allergies were reviewed. The patient's tolerance of                            previous anesthesia was also reviewed. The risks                            and benefits of the procedure and the sedation                            options and risks were discussed with the patient.                            All questions were answered, and informed consent                            was obtained. Prior Anticoagulants: The patient has                            taken no previous anticoagulant or antiplatelet                            agents. ASA Grade Assessment: II - A patient with                            mild systemic disease. After reviewing the risks                            and benefits, the patient was deemed in  satisfactory condition to undergo the procedure.                           After obtaining informed consent, the colonoscope                            was passed under direct vision. Throughout the                            procedure, the patient's blood pressure, pulse, and                            oxygen saturations were monitored continuously. The                            CF [id number] #[id number] was  introduced through the anus                            and advanced to the 3 cm into the ileum. A second                            forward view of the right colon was performed. The                            colonoscopy was performed without difficulty. The                            patient tolerated the procedure well. The quality                            of the bowel preparation was good. The terminal                            ileum, ileocecal valve, appendiceal orifice, and                            rectum were photographed. Scope In: 12:07:03 PM Scope Out: 12:20:54 PM Scope Withdrawal Time: 0 hours 10 minutes 18 seconds  Total Procedure Duration: 0 hours 13 minutes 51 seconds  Findings:                 The perianal and digital rectal examinations were                            normal.                           The entire examined colon appeared normal on direct                            and retroflexion views. There are promiment                            lymphoid follicles throughout the colon. There were  no polyps, masses, or mucosal abnormalities. Complications:            No immediate complications. Estimated Blood Loss:     Estimated blood loss: none. Impression:               - The entire examined colon is normal on direct and                            retroflexion views.                           - No specimens collected. Recommendation:           - Patient has a contact number available for                            emergencies. The signs and symptoms of potential                            delayed complications were discussed with the                            patient. Return to normal activities tomorrow.                            Written discharge instructions were provided to the                            patient.                           - Resume previous diet.                           - Continue present medications.                            - Repeat colonoscopy in 10 years for surveillance,                            earlier with new symptoms.                           - Emerging evidence supports eating a diet of                            fruits, vegetables, grains, calcium, and yogurt                            while reducing red meat and alcohol may reduce the                            risk of colon cancer.                           - Thank you for allowing me to be involved in your  colon cancer prevention. Thornton Park MD, MD 01/29/2021 12:26:13 PM This report has been signed electronically.

## 2021-01-29 NOTE — Progress Notes (Signed)
Medical history reviewed with no changes noted. VS assessed by C.W 

## 2021-01-29 NOTE — Progress Notes (Signed)
To PACU, VSS. Report to [email or web address] 

## 2021-02-05 DIAGNOSIS — F419 Anxiety disorder, unspecified: Secondary | ICD-10-CM | POA: Diagnosis not present

## 2021-02-06 DIAGNOSIS — S83282A Other tear of lateral meniscus, current injury, left knee, initial encounter: Secondary | ICD-10-CM | POA: Diagnosis not present

## 2021-02-17 DIAGNOSIS — F419 Anxiety disorder, unspecified: Secondary | ICD-10-CM | POA: Diagnosis not present

## 2021-03-03 DIAGNOSIS — F419 Anxiety disorder, unspecified: Secondary | ICD-10-CM | POA: Diagnosis not present

## 2021-03-10 DIAGNOSIS — F419 Anxiety disorder, unspecified: Secondary | ICD-10-CM | POA: Diagnosis not present

## 2021-03-17 DIAGNOSIS — F419 Anxiety disorder, unspecified: Secondary | ICD-10-CM | POA: Diagnosis not present

## 2021-03-24 DIAGNOSIS — N914 Secondary oligomenorrhea: Secondary | ICD-10-CM | POA: Diagnosis not present

## 2021-03-24 DIAGNOSIS — F419 Anxiety disorder, unspecified: Secondary | ICD-10-CM | POA: Diagnosis not present

## 2021-03-24 DIAGNOSIS — Z124 Encounter for screening for malignant neoplasm of cervix: Secondary | ICD-10-CM | POA: Diagnosis not present

## 2021-03-24 DIAGNOSIS — Z6824 Body mass index (BMI) 24.0-24.9, adult: Secondary | ICD-10-CM | POA: Diagnosis not present

## 2021-03-24 DIAGNOSIS — Z1231 Encounter for screening mammogram for malignant neoplasm of breast: Secondary | ICD-10-CM | POA: Diagnosis not present

## 2021-03-24 DIAGNOSIS — Z01419 Encounter for gynecological examination (general) (routine) without abnormal findings: Secondary | ICD-10-CM | POA: Diagnosis not present

## 2021-03-26 DIAGNOSIS — Z23 Encounter for immunization: Secondary | ICD-10-CM | POA: Diagnosis not present

## 2021-04-02 DIAGNOSIS — F419 Anxiety disorder, unspecified: Secondary | ICD-10-CM | POA: Diagnosis not present

## 2021-04-08 DIAGNOSIS — F419 Anxiety disorder, unspecified: Secondary | ICD-10-CM | POA: Diagnosis not present

## 2021-04-15 DIAGNOSIS — F419 Anxiety disorder, unspecified: Secondary | ICD-10-CM | POA: Diagnosis not present

## 2021-04-30 DIAGNOSIS — F419 Anxiety disorder, unspecified: Secondary | ICD-10-CM | POA: Diagnosis not present

## 2021-05-14 DIAGNOSIS — F419 Anxiety disorder, unspecified: Secondary | ICD-10-CM | POA: Diagnosis not present

## 2021-05-20 DIAGNOSIS — H903 Sensorineural hearing loss, bilateral: Secondary | ICD-10-CM | POA: Diagnosis not present

## 2021-05-20 DIAGNOSIS — D333 Benign neoplasm of cranial nerves: Secondary | ICD-10-CM | POA: Diagnosis not present

## 2021-05-20 DIAGNOSIS — J310 Chronic rhinitis: Secondary | ICD-10-CM | POA: Diagnosis not present

## 2021-05-20 DIAGNOSIS — J343 Hypertrophy of nasal turbinates: Secondary | ICD-10-CM | POA: Diagnosis not present

## 2021-06-05 DIAGNOSIS — Z23 Encounter for immunization: Secondary | ICD-10-CM | POA: Diagnosis not present

## 2021-06-26 DIAGNOSIS — H66012 Acute suppurative otitis media with spontaneous rupture of ear drum, left ear: Secondary | ICD-10-CM | POA: Diagnosis not present

## 2021-06-26 DIAGNOSIS — I889 Nonspecific lymphadenitis, unspecified: Secondary | ICD-10-CM | POA: Diagnosis not present

## 2021-06-26 DIAGNOSIS — H9203 Otalgia, bilateral: Secondary | ICD-10-CM | POA: Diagnosis not present

## 2021-07-08 DIAGNOSIS — M7130 Other bursal cyst, unspecified site: Secondary | ICD-10-CM | POA: Diagnosis not present

## 2021-07-08 DIAGNOSIS — I788 Other diseases of capillaries: Secondary | ICD-10-CM | POA: Diagnosis not present

## 2021-07-08 DIAGNOSIS — L720 Epidermal cyst: Secondary | ICD-10-CM | POA: Diagnosis not present

## 2021-07-08 DIAGNOSIS — Z85828 Personal history of other malignant neoplasm of skin: Secondary | ICD-10-CM | POA: Diagnosis not present

## 2021-07-08 DIAGNOSIS — C44319 Basal cell carcinoma of skin of other parts of face: Secondary | ICD-10-CM | POA: Diagnosis not present

## 2021-07-15 DIAGNOSIS — F419 Anxiety disorder, unspecified: Secondary | ICD-10-CM | POA: Diagnosis not present

## 2021-07-22 DIAGNOSIS — F419 Anxiety disorder, unspecified: Secondary | ICD-10-CM | POA: Diagnosis not present

## 2021-08-19 DIAGNOSIS — C44319 Basal cell carcinoma of skin of other parts of face: Secondary | ICD-10-CM | POA: Diagnosis not present

## 2021-09-21 DIAGNOSIS — F419 Anxiety disorder, unspecified: Secondary | ICD-10-CM | POA: Diagnosis not present

## 2021-09-24 DIAGNOSIS — F419 Anxiety disorder, unspecified: Secondary | ICD-10-CM | POA: Diagnosis not present

## 2021-10-16 ENCOUNTER — Other Ambulatory Visit: Payer: Self-pay

## 2021-10-16 ENCOUNTER — Ambulatory Visit (INDEPENDENT_AMBULATORY_CARE_PROVIDER_SITE_OTHER): Payer: BC Managed Care – PPO | Admitting: Podiatry

## 2021-10-16 ENCOUNTER — Ambulatory Visit (INDEPENDENT_AMBULATORY_CARE_PROVIDER_SITE_OTHER): Payer: BC Managed Care – PPO

## 2021-10-16 DIAGNOSIS — M79671 Pain in right foot: Secondary | ICD-10-CM

## 2021-10-16 DIAGNOSIS — M79609 Pain in unspecified limb: Secondary | ICD-10-CM | POA: Diagnosis not present

## 2021-10-16 DIAGNOSIS — M21619 Bunion of unspecified foot: Secondary | ICD-10-CM

## 2021-10-16 DIAGNOSIS — M2022 Hallux rigidus, left foot: Secondary | ICD-10-CM

## 2021-10-16 NOTE — Progress Notes (Signed)
Subjective:  ? ?Patient ID: Andrea Reyes, female   DOB: 51 y.o.   MRN: [id number]  ? ?HPI ?Patient points stating she has a lot of pain in her big toe joint left and has spur formation around it that gets sore.  States that she has tried different shoe gear and that its increasingly hard to wear and that the joint gets sore on the inside when she tries to walk.  States its been getting gradually worse over that period of time and she knows that she needs something done but she is going to [place] and would like to do it after she gets back from that trip.  Also does not smoke likes to be active ? ? ?Review of Systems  ?All other systems reviewed and are negative. ? ? ?   ?Objective:  ?Physical Exam ?Vitals and nursing note reviewed.  ?Constitutional:   ?   Appearance: She is well-developed.  ?Pulmonary:  ?   Effort: Pulmonary effort is normal.  ?Musculoskeletal:     ?   General: Normal range of motion.  ?Skin: ?   General: Skin is warm.  ?Neurological:  ?   Mental Status: She is alert.  ?  ?Neurovascular status intact muscle strength adequate range of motion subtalar midtarsal joint normal with diminished range of motion around the first MPJ left over right with pain in the joint and then prominent spurring of the first metatarsal head medial side left over right with good digital perfusion well oriented x3 ? ?   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 ?   ?Plan:  ?H&P x-rays reviewed viewed and today I went ahead and I advised this patient on the progressiveness of this condition and I do think a biplanar osteotomy to lower the first metatarsal with removal of spurs and possible reconditioning of the cartilage could be of advantage to her versus waiting and requiring fusion implant procedure.  Patient wants to undergo this and I reviewed it with her at great length and  x-rays and she is scheduled to see me in tentatively scheduled for surgery the end of April ? ?X-rays indicate that there is spurring of the first metatarsal head left over right elevated segment left over right narrow joint surface first MPJ left over right ?   ? ? ?

## 2021-10-16 NOTE — Patient Instructions (Signed)
Hallux Rigidus ?Hallux rigidus is a type of joint pain or joint disease (degenerativearthritis) that affects your big toe (hallux). This condition involves the joint that connects the base of your big toe to the main part of your foot (metatarsophalangeal joint or MTP joint). ?This condition can cause your big toe to become stiff, painful, and difficult to move. Symptoms may get worse with movement or in cold or damp weather. The condition gets worse over time. ?What are the causes? ?This condition may be caused by having a foot that does not function the way that it should or that has an abnormal shape (structural deformity). These foot problems can run in families and may be passed down from parents to children (are hereditary). This condition can also be caused by: ?Injury. ?Overuse. ?What increases the risk? ?You are more likely to develop this condition if you have: ?A foot bone (metatarsal) that is longer or higher than normal. ?A family history of hallux rigidus. ?Previously injured your big toe. ?Feet that do not have a curve (arch) on the inner side of the foot. This may be called flat feet or fallen arches. ?Ankles that turn in when you walk (pronation). ?Rheumatoid arthritis or gout. ?A job that requires you to stoop down often at work. ?What are the signs or symptoms? ?Symptoms of this condition include: ?Big toe pain. ?Stiffness and difficulty moving the big toe. ?Swelling of the toe and surrounding area. ?Bone spurs. These are bony growths that can form on the joint of the big toe. ?A limp. ?How is this diagnosed? ?This condition is diagnosed based on your medical history and a physical exam. You may also have X-rays. ?How is this treated? ?This condition is treated by: ?Wearing roomy, comfortable shoes that have a large toe box. ?Putting orthotic devices in your shoes. ?Taking pain medicines. ?Having physical therapy. ?Icing the injured area. ?Alternating between putting your foot in cold water and  then in warm water. ?If your condition is severe, treatment may include: ?Corticosteroid injections to relieve pain. ?Surgery to remove bone spurs, fuse damaged bones together, or replace the entire joint. ?Follow these instructions at home: ?Managing pain, stiffness, and swelling ? ?Put your feet in cold water for 30 seconds, and then in warm water for 30 seconds. Alternate between the cold and warm water for 5 minutes. Do this several times a day or as told by your health care provider. ?If directed, put ice on the injured area. ?Put ice in a plastic bag. ?Place a towel between your skin and the bag. ?Leave the ice on for 20 minutes, 2-3 times a day. ?General instructions ?Take over-the-counter and prescription medicines only as told by your health care provider. ?Do not wear high heels or other restrictive footwear. Wear comfortable, supportive shoes that have a large toe box. ?Wear shoe inserts (orthotics) as told by your health care provider, if this applies. ?Do foot exercises as instructed by your health care provider or a physical therapist. ?Keep all follow-up visits as told by your health care provider. This is important. ?Contact a health care provider if: ?You notice bone spurs or growths on or around your big toe. ?Your pain does not get better or it gets worse. ?You have pain while resting. ?You have pain in other parts of your body, such as your back, hip, or knee. ?You start to limp. ?Summary ?Hallux rigidus is a condition that makes your big toe become stiff, painful, and difficult to move. ?It can be caused  by injury, overuse, or inflammatory diseases. ?This condition may be treated with ice, medicines, physical therapy, and surgery. ?Do not wear high heels or other restrictive footwear. Wear comfortable, supportive shoes that have a large toe box. ?This information is not intended to replace advice given to you by your health care provider. Make sure you discuss any questions you have with your  health care provider. ?Document Revised: 11/27/2020 Document Reviewed: 11/27/2020 ?Elsevier Patient Education ? [place]. ? ?

## 2021-10-19 ENCOUNTER — Telehealth: Payer: Self-pay | Admitting: Urology

## 2021-10-19 NOTE — Telephone Encounter (Signed)
DOS - 11/17/21 ? ?[place] BUNIONECTOMY LEFT --- [phone] ? ? ?[employer]BS EFFECTIVE DATE - 08/02/21 ? ?PLAN DEDUCTIBLE - $5,450.00 W/ $2,722.44 REMAINING ?OUT OF POCKET - $10,000.00 W/ $7,246.51 REMAINING ?COINSURANCE - $4,550.00 W/ $4,550.00 REMAINING ?COPAY - $0.00 ? ? ?NO PRIOR AUTH REQUIRED.  ?

## 2021-11-04 DIAGNOSIS — F419 Anxiety disorder, unspecified: Secondary | ICD-10-CM | POA: Diagnosis not present

## 2021-11-05 ENCOUNTER — Ambulatory Visit (INDEPENDENT_AMBULATORY_CARE_PROVIDER_SITE_OTHER): Payer: BC Managed Care – PPO | Admitting: Podiatry

## 2021-11-05 ENCOUNTER — Encounter: Payer: Self-pay | Admitting: Podiatry

## 2021-11-05 DIAGNOSIS — M2022 Hallux rigidus, left foot: Secondary | ICD-10-CM

## 2021-11-05 NOTE — Progress Notes (Signed)
Subjective:  ? ?Patient ID: Andrea Reyes, female   DOB: 51 y.o.   MRN: [id number]  ? ?HPI ?Patient states she is here for consult concerning correction of her big toe joint left stating its been very sore when she tries to be active ? ? ?ROS ? ? ?   ?Objective:  ?Physical Exam  ?Neurovascular status intact patient is found to have reduced range of motion first MPJ left with inflammation fluid within the joint with pressure ? ?   ?Assessment:  ?Hallux limitus rigidus deformity left with bone spur formation and pain ? ?   ?Plan:  ?H&P reviewed x-rays at great length discussed the causes of this and treatment options and at this point I recommended removal of bone spur biplanar osteotomy and I explained procedure risk and patient wants surgery after extensive review.  Understands no guarantees as far as success that possibility for fusion or joint implantation is possible in the future but at this point I am confident this will give her good relief of symptoms.  She understands total recovery can take approximately 6 months and at this point air fracture walker is dispensed all instructions on usage given the patient and patient will wear it prior to surgery to get used to it.  Scheduled outpatient surgery encouraged to call questions concerns ?   ? ? ?

## 2021-11-16 ENCOUNTER — Telehealth: Payer: Self-pay | Admitting: Podiatry

## 2021-11-16 MED ORDER — ONDANSETRON HCL 4 MG PO TABS: 4.0000 mg | ORAL_TABLET | Freq: Three times a day (TID) | ORAL | 0 refills | Status: DC | PRN

## 2021-11-16 MED ORDER — OXYCODONE-ACETAMINOPHEN 10-325 MG PO TABS: 1.0000 | ORAL_TABLET | ORAL | 0 refills | Status: DC | PRN

## 2021-11-16 NOTE — Telephone Encounter (Addendum)
1530 hrs- Patient called a second time asking for someone to please call her she is scheduled for surgery tomorrow and she took some ibuprophen. Please call pt. ? ?Patient called in stating she is scheduled to have surgery tomorrow with Dr Paulla Dolly but she wanted to talk to someone first because she is having some issues and wants to make sure she can still proceed. Pt's number is [phone]. ?

## 2021-11-16 NOTE — Addendum Note (Signed)
Addended by: Wallene Huh on: 11/16/2021 03:15 PM ? ? Modules accepted: Orders ? ?

## 2021-11-17 ENCOUNTER — Encounter: Payer: Self-pay | Admitting: Podiatry

## 2021-11-17 DIAGNOSIS — M2022 Hallux rigidus, left foot: Secondary | ICD-10-CM | POA: Diagnosis not present

## 2021-11-17 DIAGNOSIS — M2012 Hallux valgus (acquired), left foot: Secondary | ICD-10-CM | POA: Diagnosis not present

## 2021-11-23 ENCOUNTER — Encounter: Payer: Self-pay | Admitting: Podiatry

## 2021-11-23 ENCOUNTER — Ambulatory Visit (INDEPENDENT_AMBULATORY_CARE_PROVIDER_SITE_OTHER): Payer: BC Managed Care – PPO | Admitting: Podiatry

## 2021-11-23 ENCOUNTER — Ambulatory Visit (INDEPENDENT_AMBULATORY_CARE_PROVIDER_SITE_OTHER): Payer: BC Managed Care – PPO

## 2021-11-23 DIAGNOSIS — Z9889 Other specified postprocedural states: Secondary | ICD-10-CM | POA: Diagnosis not present

## 2021-11-23 NOTE — Progress Notes (Signed)
Subjective:  ? ?Patient ID: Andrea Reyes, female   DOB: 51 y.o.   MRN: [id number]  ? ?HPI ?Patient states doing great with surgery with minimal discomfort very pleased and able to walk in her boot with no problem no ? ? ?ROS ? ? ?   ?Objective:  ?Physical Exam  ?Vascular status intact negative Bevelyn Buckles' sign noted wound edges coapted well hallux in rectus position with good range of motion no crepitus of the joint noted ? ?   ?Assessment:  ?Doing well post osteotomy first metatarsal left ? ?   ?Plan:  ?H&P x-ray reviewed and may begin slowly wearing surgical shoe with range of motion exercises explained to patient.  Will be seen back again in 4 weeks or earlier if needed ? ?X-rays indicate osteotomies healing well fixation in place joint congruence ?   ? ? ?

## 2021-11-24 DIAGNOSIS — I788 Other diseases of capillaries: Secondary | ICD-10-CM | POA: Diagnosis not present

## 2021-11-24 DIAGNOSIS — D224 Melanocytic nevi of scalp and neck: Secondary | ICD-10-CM | POA: Diagnosis not present

## 2021-11-24 DIAGNOSIS — L720 Epidermal cyst: Secondary | ICD-10-CM | POA: Diagnosis not present

## 2021-11-24 DIAGNOSIS — Z85828 Personal history of other malignant neoplasm of skin: Secondary | ICD-10-CM | POA: Diagnosis not present

## 2021-11-26 ENCOUNTER — Encounter: Payer: Self-pay | Admitting: Podiatry

## 2021-11-26 ENCOUNTER — Telehealth: Payer: Self-pay | Admitting: *Deleted

## 2021-11-26 NOTE — Telephone Encounter (Signed)
Please advise 

## 2021-11-26 NOTE — Telephone Encounter (Signed)
Patient is calling with concerns that she may be doing something wrong, is moving her toe as instructed but it seems to a little more black and blue now. She will forward some pictures thru myChart so that physician can take a look.  ?

## 2021-12-07 ENCOUNTER — Encounter: Payer: Self-pay | Admitting: Podiatry

## 2021-12-07 ENCOUNTER — Ambulatory Visit (INDEPENDENT_AMBULATORY_CARE_PROVIDER_SITE_OTHER): Payer: BC Managed Care – PPO

## 2021-12-07 ENCOUNTER — Ambulatory Visit (INDEPENDENT_AMBULATORY_CARE_PROVIDER_SITE_OTHER): Payer: BC Managed Care – PPO | Admitting: Podiatry

## 2021-12-07 DIAGNOSIS — Z9889 Other specified postprocedural states: Secondary | ICD-10-CM

## 2021-12-07 NOTE — Progress Notes (Signed)
Subjective:  ? ?Patient ID: Andrea Reyes, female   DOB: 51 y.o.   MRN: [id number]  ? ?HPI ?Patient states doing very well with surgery but wanted to know if I can start to increase my activity.  Still wearing surgical shoe ? ? ?ROS ? ? ?   ?Objective:  ?Physical Exam  ?Neurovascular status intact negative Bevelyn Buckles' sign noted left foot healing well wound edges well coapted hallux range of motion good no crepitus of the joint was noted with adequate motion for this.  Postop ? ?   ?Assessment:  ?Doing well post distal osteotomy first metatarsal left foot ? ?   ?Plan:  ?Advised on range of motion reviewed x-rays May return to soft tennis shoes now with ankle compression stocking continue elevation compression and partial immobilization and will be seen back as needed ? ?X-rays indicate osteotomies healing well joint looks healthy congruence fixation in place ?   ? ? ?

## 2021-12-11 ENCOUNTER — Encounter: Payer: Self-pay | Admitting: Podiatry

## 2021-12-11 NOTE — Telephone Encounter (Signed)
Please advise 

## 2021-12-21 ENCOUNTER — Encounter: Payer: BC Managed Care – PPO | Admitting: Podiatrist

## 2021-12-23 ENCOUNTER — Encounter: Payer: BC Managed Care – PPO | Admitting: Podiatry

## 2022-01-14 DIAGNOSIS — E538 Deficiency of other specified B group vitamins: Secondary | ICD-10-CM | POA: Diagnosis not present

## 2022-01-14 DIAGNOSIS — Z13220 Encounter for screening for lipoid disorders: Secondary | ICD-10-CM | POA: Diagnosis not present

## 2022-01-14 DIAGNOSIS — Z0000 Encounter for general adult medical examination without abnormal findings: Secondary | ICD-10-CM | POA: Diagnosis not present

## 2022-01-15 ENCOUNTER — Encounter: Payer: Self-pay | Admitting: Podiatry

## 2022-01-20 DIAGNOSIS — F419 Anxiety disorder, unspecified: Secondary | ICD-10-CM | POA: Diagnosis not present

## 2022-02-18 DIAGNOSIS — L280 Lichen simplex chronicus: Secondary | ICD-10-CM | POA: Diagnosis not present

## 2022-02-18 DIAGNOSIS — Z85828 Personal history of other malignant neoplasm of skin: Secondary | ICD-10-CM | POA: Diagnosis not present

## 2022-02-18 DIAGNOSIS — L821 Other seborrheic keratosis: Secondary | ICD-10-CM | POA: Diagnosis not present

## 2022-04-01 DIAGNOSIS — R519 Headache, unspecified: Secondary | ICD-10-CM | POA: Diagnosis not present

## 2022-04-02 ENCOUNTER — Ambulatory Visit (INDEPENDENT_AMBULATORY_CARE_PROVIDER_SITE_OTHER): Payer: BC Managed Care – PPO

## 2022-04-02 ENCOUNTER — Encounter: Payer: Self-pay | Admitting: Podiatry

## 2022-04-02 ENCOUNTER — Ambulatory Visit (INDEPENDENT_AMBULATORY_CARE_PROVIDER_SITE_OTHER): Payer: BC Managed Care – PPO | Admitting: Podiatry

## 2022-04-02 DIAGNOSIS — M21619 Bunion of unspecified foot: Secondary | ICD-10-CM

## 2022-04-02 DIAGNOSIS — M7752 Other enthesopathy of left foot: Secondary | ICD-10-CM

## 2022-04-02 DIAGNOSIS — Z1231 Encounter for screening mammogram for malignant neoplasm of breast: Secondary | ICD-10-CM | POA: Diagnosis not present

## 2022-04-02 DIAGNOSIS — M21612 Bunion of left foot: Secondary | ICD-10-CM | POA: Diagnosis not present

## 2022-04-02 MED ORDER — TRIAMCINOLONE ACETONIDE 10 MG/ML IJ SUSP
10.0000 mg | Freq: Once | INTRAMUSCULAR | Status: AC
  Administered 2022-04-02: 10 mg

## 2022-04-02 NOTE — Progress Notes (Signed)
Patient subjective:   Patient ID: Andrea Reyes, female   DOB: 51 y.o.   MRN: [id number]   HPI Presents stating that she has developed a lot of pain in the outside of her left foot that she is very happy with the surgery and doing well but the outside the foot has been bothersome   ROS      Objective:  Physical Exam  Neuro vascular status intact inflammation capsulitis fifth MPJ left with fluid buildup around the joint     Assessment:  Doing well structural hallux limitus correction left with inflammatory capsulitis fifth MPJ that may be due to change in gait     Plan:  H&P x-ray reviewed went ahead did sterile prep injected the fifth MPJ 3 mg dexamethasone Kenalog 5 mg Xylocaine advised on wider shoes anti-inflammatories and reappoint to recheck  X-rays indicate there is inflammation around the fifth MPJ left the first MPJ looks good the pins are in place the alignment of the joint looks good

## 2022-04-15 DIAGNOSIS — F419 Anxiety disorder, unspecified: Secondary | ICD-10-CM | POA: Diagnosis not present

## 2022-06-21 DIAGNOSIS — Z6827 Body mass index (BMI) 27.0-27.9, adult: Secondary | ICD-10-CM | POA: Diagnosis not present

## 2022-06-21 DIAGNOSIS — Z01419 Encounter for gynecological examination (general) (routine) without abnormal findings: Secondary | ICD-10-CM | POA: Diagnosis not present

## 2022-06-21 DIAGNOSIS — Z124 Encounter for screening for malignant neoplasm of cervix: Secondary | ICD-10-CM | POA: Diagnosis not present

## 2022-06-29 DIAGNOSIS — F419 Anxiety disorder, unspecified: Secondary | ICD-10-CM | POA: Diagnosis not present

## 2022-07-05 DIAGNOSIS — F419 Anxiety disorder, unspecified: Secondary | ICD-10-CM | POA: Diagnosis not present

## 2022-07-12 DIAGNOSIS — F419 Anxiety disorder, unspecified: Secondary | ICD-10-CM | POA: Diagnosis not present

## 2022-07-19 DIAGNOSIS — F419 Anxiety disorder, unspecified: Secondary | ICD-10-CM | POA: Diagnosis not present

## 2022-09-07 DIAGNOSIS — M25531 Pain in right wrist: Secondary | ICD-10-CM | POA: Diagnosis not present

## 2022-09-07 DIAGNOSIS — M25521 Pain in right elbow: Secondary | ICD-10-CM | POA: Diagnosis not present

## 2022-09-07 DIAGNOSIS — S63501A Unspecified sprain of right wrist, initial encounter: Secondary | ICD-10-CM | POA: Diagnosis not present

## 2022-09-08 ENCOUNTER — Other Ambulatory Visit: Payer: Self-pay | Admitting: Family Medicine

## 2022-09-08 DIAGNOSIS — E7800 Pure hypercholesterolemia, unspecified: Secondary | ICD-10-CM | POA: Diagnosis not present

## 2022-09-08 DIAGNOSIS — J310 Chronic rhinitis: Secondary | ICD-10-CM | POA: Diagnosis not present

## 2022-09-08 DIAGNOSIS — R1013 Epigastric pain: Secondary | ICD-10-CM | POA: Diagnosis not present

## 2022-09-08 DIAGNOSIS — J343 Hypertrophy of nasal turbinates: Secondary | ICD-10-CM | POA: Diagnosis not present

## 2022-09-08 DIAGNOSIS — H903 Sensorineural hearing loss, bilateral: Secondary | ICD-10-CM | POA: Diagnosis not present

## 2022-09-15 ENCOUNTER — Ambulatory Visit
Admission: RE | Admit: 2022-09-15 | Discharge: 2022-09-15 | Disposition: A | Discharge status: Home / Self Care | Payer: No Typology Code available for payment source | Source: Ambulatory Visit | Attending: Family Medicine | Admitting: Family Medicine

## 2022-09-15 DIAGNOSIS — E7800 Pure hypercholesterolemia, unspecified: Secondary | ICD-10-CM

## 2022-09-21 DIAGNOSIS — R1013 Epigastric pain: Secondary | ICD-10-CM | POA: Diagnosis not present

## 2022-10-20 ENCOUNTER — Ambulatory Visit: Payer: BC Managed Care – PPO | Admitting: Gastroenterology

## 2022-11-04 DIAGNOSIS — F419 Anxiety disorder, unspecified: Secondary | ICD-10-CM | POA: Diagnosis not present

## 2022-11-17 DIAGNOSIS — R6883 Chills (without fever): Secondary | ICD-10-CM | POA: Diagnosis not present

## 2022-11-17 DIAGNOSIS — J029 Acute pharyngitis, unspecified: Secondary | ICD-10-CM | POA: Diagnosis not present

## 2022-11-17 DIAGNOSIS — Z03818 Encounter for observation for suspected exposure to other biological agents ruled out: Secondary | ICD-10-CM | POA: Diagnosis not present

## 2022-11-17 DIAGNOSIS — R52 Pain, unspecified: Secondary | ICD-10-CM | POA: Diagnosis not present

## 2022-11-17 DIAGNOSIS — R5383 Other fatigue: Secondary | ICD-10-CM | POA: Diagnosis not present

## 2022-11-22 ENCOUNTER — Emergency Department (HOSPITAL_BASED_OUTPATIENT_CLINIC_OR_DEPARTMENT_OTHER)
Admission: EM | Admit: 2022-11-22 | Discharge: 2022-11-22 | Disposition: A | Discharge status: Home / Self Care | Payer: BC Managed Care – PPO | Attending: Emergency Medicine | Admitting: Emergency Medicine

## 2022-11-22 ENCOUNTER — Encounter (HOSPITAL_BASED_OUTPATIENT_CLINIC_OR_DEPARTMENT_OTHER): Payer: Self-pay | Admitting: Emergency Medicine

## 2022-11-22 ENCOUNTER — Other Ambulatory Visit: Payer: Self-pay

## 2022-11-22 DIAGNOSIS — J0140 Acute pansinusitis, unspecified: Secondary | ICD-10-CM

## 2022-11-22 DIAGNOSIS — R059 Cough, unspecified: Secondary | ICD-10-CM | POA: Diagnosis not present

## 2022-11-22 NOTE — ED Triage Notes (Signed)
Pt arrives to ED with c/o URI over the past week. Symptoms include sinus pressure, left side upper mouth pain, tooth ache, headache, cough.

## 2022-11-22 NOTE — ED Notes (Signed)
Patient verbalizes understanding of discharge instructions. Opportunity for questioning and answers were provided. Patient discharged from ED.  °

## 2022-11-22 NOTE — Discharge Instructions (Signed)
Thank you for allowing me to be part of your care today.  You are already on the appropriate treatment for an acute bacterial sinus infection.    Please continue to take the Augmentin for the entire 10 days and take the prednisone as prescribed.  While you are taking prednisone do not take other NSAIDs such as naproxen or ibuprofen as this may increase your risk of bleeding.  Tylenol is safe to take, but do not exceed 4000 mg in a 24-hour period.   I recommend use of a cool-mist humidifier, especially at nighttime, to help keep nasal passages moist.  You may also use saline spray a few times throughout the day to help with inflammation, dryness, and to thin mucus.  I recommend holding off on Sudafed as this can be very drying and irritating to the nasal passages.  For cough during the day, I recommend taking spoonfuls of honey as this helps coat the cough reflex.  For nighttime cough, I recommend taking Delsym (dextromethorphan) cough syrup.  Avoid combination medications that have an expectorant.  If your cold-like symptoms improve, but you continue to have allergy type symptoms, I recommend a second-generation antihistamine such as Zyrtec, Allegra, or Claritin daily.  Please follow-up with your primary care provider if you continue to have symptoms despite antibiotics, prednisone, and over-the-counter medications.

## 2022-11-22 NOTE — ED Provider Notes (Signed)
[place] EMERGENCY DEPARTMENT AT [facility] Provider Note   CSN: [id number] Arrival date & time: 11/22/22  1458     History  Chief Complaint  Patient presents with   URI    Andrea Reyes is a 52 y.o. female with past medical history significant for allergic rhinitis, chronic sinusitis, s/p septoplasty with turbinate reduction, anxiety, GERD, hyperlipidemia presents to the ED complaining of URI symptoms for the past week.  Patient complains of sinus pressure, left side palatal and tooth pain, headache, non-productive cough, and post-nasal drip.  She has been evaluated by the walk-in clinic and has been started on prednisone and Augmentin, which she has had one day of.  Patient has also been using OTC medications with minimal relief.  She feels that her sinuses are very dry and are "burning".  She reports she has a "lump" on her palate on the upper left that is smaller in size since starting Augmentin.  Denies fever, chills, facial swelling, trouble swallowing, chest pain, shortness of breath, nausea, vomiting.  She has been tested already for COVID, flu, RSV, and strep which were all negative.        Home Medications Prior to Admission medications   Medication Sig Start Date End Date Taking? Authorizing Provider  B Complex-Folic Acid (B COMPLEX-VITAMIN B12 PO) Take by mouth daily.    [provider]  butalbital-acetaminophen-caffeine (FIORICET, ESGIC) [phone] MG tablet  03/21/18   [provider]  cetirizine (ZYRTEC ALLERGY) 10 MG tablet 1 tablet    [provider]  ibuprofen (ADVIL) 400 MG tablet Take by mouth.    [provider]  ondansetron (ZOFRAN) 4 MG tablet Take 1 tablet (4 mg total) by mouth every 8 (eight) hours as needed for nausea or vomiting. 11/16/21   Lenn Sink, DPM  oxyCODONE-acetaminophen (PERCOCET) 10-325 MG tablet Take 1 tablet by mouth every 4 (four) hours as needed for pain. 11/16/21   Lenn Sink, DPM   sertraline (ZOLOFT) 100 MG tablet Take 100 mg by mouth daily.    [provider]  sertraline (ZOLOFT) 25 MG tablet Take 25 mg by mouth daily. 12/31/20   [provider]  vitamin C (ASCORBIC ACID) 500 MG tablet Take 500 mg by mouth daily.    [provider]      Allergies    Erythromycin    Review of Systems   Review of Systems  Constitutional:  Negative for chills and fever.  HENT:  Positive for congestion, dental problem (upper left tooth pain), postnasal drip, sinus pressure and sinus pain. Negative for facial swelling and trouble swallowing.   Respiratory:  Positive for cough. Negative for shortness of breath.   Cardiovascular:  Negative for chest pain.  Gastrointestinal:  Negative for nausea and vomiting.    Physical Exam Updated Vital Signs BP 125/80 (BP Location: Right Arm)   Pulse 90   Temp 98.3 F (36.8 C) (Oral)   Resp 15   Ht  (1.575 m)   Wt 63.5 kg   SpO2 97%   BMI 25.61 kg/m  Physical Exam Vitals and nursing note reviewed.  Constitutional:      General: She is not in acute distress.    Appearance: Normal appearance. She is not ill-appearing or diaphoretic.  HENT:     Head: Normocephalic and atraumatic.     Right Ear: Hearing, tympanic membrane and ear canal normal.     Left Ear: Hearing, tympanic membrane and ear canal normal.  Nose: Mucosal edema and congestion present. No nasal tenderness or rhinorrhea.     Right Turbinates: Swollen.     Left Turbinates: Swollen.     Right Sinus: Maxillary sinus tenderness and frontal sinus tenderness present.     Left Sinus: Maxillary sinus tenderness and frontal sinus tenderness present.     Mouth/Throat:     Lips: Pink.     Mouth: Mucous membranes are moist. No oral lesions.     Pharynx: Oropharynx is clear. Uvula midline. Posterior oropharyngeal erythema (mild) present. No pharyngeal swelling, oropharyngeal exudate or uvula swelling.     Tonsils: No tonsillar exudate.     Comments:  Small, swollen area to left side of palate with tenderness.  Clear post-nasal drip appreciated during exam.  Cardiovascular:     Rate and Rhythm: Normal rate and regular rhythm.     Heart sounds: Normal heart sounds.  Pulmonary:     Effort: Pulmonary effort is normal. No tachypnea, accessory muscle usage or respiratory distress.     Breath sounds: Normal breath sounds and air entry. No wheezing.  Neurological:     Mental Status: She is alert. Mental status is at baseline.  Psychiatric:        Mood and Affect: Mood normal.        Behavior: Behavior normal.     ED Results / Procedures / Treatments   Labs (all labs ordered are listed, but only abnormal results are displayed) Labs Reviewed - No data to display  EKG None  Radiology No results found.  Procedures Procedures    Medications Ordered in ED Medications - No data to display  ED Course/ Medical Decision Making/ A&P                             Medical Decision Making  This patient presents to the ED with chief complaint(s) of URI symptoms and possible sinus infection with pertinent past medical history of chronic sinusitis, allergic rhinitis, s/p septoplasty with turbinate reduction.  The complaint involves an extensive differential diagnosis and also carries with it a high risk of complications and morbidity.    The differential diagnosis includes acute bacterial sinusitis, allergic rhinitis, viral URI   Initial Assessment:   Exam significant for mildly erythematous posterior oropharynx with clear post-nasal drip.  There is a small, swollen area to the left side of the palate with tenderness to palpation.  No obvious dental abscess, drainage, or bleeding.  Nose with congestion, no rhinorrhea.  Turbinates are mildly swollen.  Mucous membranes are all moist.    Disposition:   Discussed with patient OTC treatment options for symptoms management and supportive care.  Patient likely has acute sinusitis and is already on  appropriate treatment, will likely need to give it more time since she has only been taking it for 24 hours.  Patient verbalized her understanding.   The patient has been appropriately medically screened and/or stabilized in the ED. I have low suspicion for any other emergent medical condition which would require further screening, evaluation or treatment in the ED or require inpatient management. At time of discharge the patient is hemodynamically stable and in no acute distress. I have discussed work-up results and diagnosis with patient and answered all questions. Patient is agreeable with discharge plan. We discussed strict return precautions for returning to the emergency department and they verbalized understanding.            Final Clinical Impression(s) /  ED Diagnoses Final diagnoses:  Acute non-recurrent pansinusitis    Rx / DC Orders ED Discharge Orders     None         Lenard Simmer, PA-C 11/22/22 1616    Alvira Monday, MD 11/23/22 1348

## 2022-11-23 DIAGNOSIS — J343 Hypertrophy of nasal turbinates: Secondary | ICD-10-CM | POA: Diagnosis not present

## 2022-11-23 DIAGNOSIS — J0101 Acute recurrent maxillary sinusitis: Secondary | ICD-10-CM | POA: Diagnosis not present

## 2022-11-30 ENCOUNTER — Other Ambulatory Visit: Payer: Self-pay | Admitting: Otolaryngology

## 2022-11-30 DIAGNOSIS — J320 Chronic maxillary sinusitis: Secondary | ICD-10-CM

## 2022-12-23 ENCOUNTER — Ambulatory Visit: Payer: BC Managed Care – PPO | Admitting: Gastroenterology

## 2023-01-03 ENCOUNTER — Ambulatory Visit
Admission: RE | Admit: 2023-01-03 | Discharge: 2023-01-03 | Disposition: A | Discharge status: Home / Self Care | Payer: BC Managed Care – PPO | Source: Ambulatory Visit | Attending: Otolaryngology | Admitting: Otolaryngology

## 2023-01-03 DIAGNOSIS — J3489 Other specified disorders of nose and nasal sinuses: Secondary | ICD-10-CM | POA: Diagnosis not present

## 2023-01-03 DIAGNOSIS — J320 Chronic maxillary sinusitis: Secondary | ICD-10-CM

## 2023-01-11 DIAGNOSIS — E538 Deficiency of other specified B group vitamins: Secondary | ICD-10-CM | POA: Diagnosis not present

## 2023-01-11 DIAGNOSIS — R1013 Epigastric pain: Secondary | ICD-10-CM | POA: Diagnosis not present

## 2023-01-11 DIAGNOSIS — Z79899 Other long term (current) drug therapy: Secondary | ICD-10-CM | POA: Diagnosis not present

## 2023-01-11 DIAGNOSIS — L570 Actinic keratosis: Secondary | ICD-10-CM | POA: Diagnosis not present

## 2023-01-11 DIAGNOSIS — D2239 Melanocytic nevi of other parts of face: Secondary | ICD-10-CM | POA: Diagnosis not present

## 2023-01-11 DIAGNOSIS — D2261 Melanocytic nevi of right upper limb, including shoulder: Secondary | ICD-10-CM | POA: Diagnosis not present

## 2023-01-11 DIAGNOSIS — L821 Other seborrheic keratosis: Secondary | ICD-10-CM | POA: Diagnosis not present

## 2023-01-11 DIAGNOSIS — Z23 Encounter for immunization: Secondary | ICD-10-CM | POA: Diagnosis not present

## 2023-01-11 DIAGNOSIS — B079 Viral wart, unspecified: Secondary | ICD-10-CM | POA: Diagnosis not present

## 2023-01-11 DIAGNOSIS — D485 Neoplasm of uncertain behavior of skin: Secondary | ICD-10-CM | POA: Diagnosis not present

## 2023-01-11 DIAGNOSIS — Z85828 Personal history of other malignant neoplasm of skin: Secondary | ICD-10-CM | POA: Diagnosis not present

## 2023-01-11 DIAGNOSIS — E7800 Pure hypercholesterolemia, unspecified: Secondary | ICD-10-CM | POA: Diagnosis not present

## 2023-01-12 NOTE — Progress Notes (Signed)
01/14/2023 Andrea Reyes [id number] November 05, 1970  Referring provider: Aliene Beams, MD Primary GI doctor: Dr. Leonides Schanz  (Dr. Orvan Falconer)  ASSESSMENT AND PLAN:   Gastroesophageal reflux disease, unspecified whether esophagitis present Lifestyle changes discussed, avoid NSAIDS, ETOH 2 years GERD, no alarm symptoms but patient unable to get off PPI, has some upper pharyngeal symptoms which she relates to sinuses but could be LPR Continue protonix Discussed weight loss Will schedule EGD at [facility] with Dr. Leonides Schanz for evaluation of esophagitis, EOE, gastritis.  I discussed risks of EGD with patient today, including risk of sedation, bleeding or perforation.  Patient provides understanding and gave verbal consent to proceed.  Special screening for malignant neoplasms, colon Recall 2032   Patient Care Team: Aliene Beams, MD as PCP - General (Family Medicine)  HISTORY OF PRESENT ILLNESS: 52 y.o. female with a past medical history of allergies, acoustic neuroma,and others listed below presents for evaluation of dyspepsia.   01/29/2021 colonoscopy Dr. Orvan Falconer for screening purposes, good bowel prep completely unremarkable recall 10 years.  Patient reports GERD for several years, after her kids she had some weight gain that feels made it worse. She has been having reflux of acid into her throat and regurgitation of foods. She was started on protonix for 2 months with improvement of her symptoms and she stopped with return of her symptoms. She has GERD daily before PPI and has improved since being on PPI, occ nocturnal symptoms. She denies dysphagia, melena.  She denies nausea, vomiting.  She  denies AB bloating.  No unintentional weight loss, no night sweats.  She denies family history of gallbladder issues.  She denies blood thinner use.  She reports NSAID use, she takes 400 mg occ for sinus pressure and joint pain, took two last night and 2 this aM but normally once every two week.   She reports ETOH use, once a week recently with graduations/visitors but typically a few glasses a month.   She denies tobacco use.  She denies drug use.    She  reports that she has never smoked. She has never used smokeless tobacco. She reports current alcohol use. She reports that she does not use drugs.  RELEVANT LABS AND IMAGING: CBC    Component Value Date/Time   WBC 6.1 09/08/2010 1146   RBC 4.58 09/08/2010 1146   HGB 12.4 09/08/2010 1146   HCT 38.4 09/08/2010 1146   PLT 259 09/08/2010 1146   MCV 83.8 09/08/2010 1146   MCH 27.1 09/08/2010 1146   MCHC 32.3 09/08/2010 1146   RDW 15.3 09/08/2010 1146        Current Medications:       Current Outpatient Medications (Respiratory):    cetirizine (ZYRTEC ALLERGY) 10 MG tablet, 1 tablet   Current Outpatient Medications (Analgesics):    butalbital-acetaminophen-caffeine (FIORICET, ESGIC) 50-325-40 MG tablet,    ibuprofen (ADVIL) 400 MG tablet, Take by mouth.   oxyCODONE-acetaminophen (PERCOCET) 10-325 MG tablet, Take 1 tablet by mouth every 4 (four) hours as needed for pain. (Patient not taking: Reported on 01/14/2023)     Current Outpatient Medications (Other):    B Complex-Folic Acid (B COMPLEX-VITAMIN B12 PO), Take by mouth daily.   Cholecalciferol (VITAMIN D3) 50 MCG (2000 UT) capsule, as directed Orally once a day   sertraline (ZOLOFT) 100 MG tablet, Take 100 mg by mouth daily.   sertraline (ZOLOFT) 25 MG tablet, Take 25 mg by mouth daily.   vitamin C (ASCORBIC ACID) 500 MG tablet, Take 500  mg by mouth daily.   ondansetron (ZOFRAN) 4 MG tablet, Take 1 tablet (4 mg total) by mouth every 8 (eight) hours as needed for nausea or vomiting. (Patient not taking: Reported on 01/14/2023)  Current Facility-Administered Medications (Other):    0.9 %  sodium chloride infusion  Medical History:  Past Medical History:  Diagnosis Date   Allergy    Anemia    Anxiety    Arthritis    Depression    GERD (gastroesophageal  reflux disease)    Hyperlipidemia    Allergies:  Allergies  Allergen Reactions   Erythromycin Rash and Other (See Comments)     Surgical History:  She  has a past surgical history that includes Septoplasty (09/2018) and Excision vaginal cyst. Family History:  Her family history is not on file.  REVIEW OF SYSTEMS  : All other systems reviewed and negative except where noted in the History of Present Illness.  PHYSICAL EXAM: BP 122/82 (BP Location: Left Arm, Patient Position: Sitting, Cuff Size: Normal)   Pulse 87   Ht 5\' 2"  (1.575 m)   Wt 147 lb 8 oz (66.9 kg)   SpO2 91%   BMI 26.98 kg/m  General Appearance: Well nourished, in no apparent distress. Head:   Normocephalic and atraumatic. Eyes:  sclerae anicteric,conjunctive pink  Respiratory: Respiratory effort normal, BS equal bilaterally without rales, rhonchi, wheezing. Cardio: RRR with no MRGs. Peripheral pulses intact.  Abdomen: Soft,  Non-distended ,active bowel sounds. No tenderness . Without guarding and Without rebound. No masses. Rectal: Not evaluated Musculoskeletal: Full ROM, Normal gait. Without edema. Skin:  Dry and intact without significant lesions or rashes Neuro: Alert and  oriented x4;  No focal deficits. Psych:  Cooperative. Normal mood and affect.    Doree Albee, PA-C 12:04 PM

## 2023-01-14 ENCOUNTER — Encounter: Payer: Self-pay | Admitting: Physician Assistant

## 2023-01-14 ENCOUNTER — Ambulatory Visit (INDEPENDENT_AMBULATORY_CARE_PROVIDER_SITE_OTHER): Payer: BC Managed Care – PPO | Admitting: Physician Assistant

## 2023-01-14 VITALS — BP 122/82 | HR 87 | Ht 62.0 in | Wt 147.5 lb

## 2023-01-14 DIAGNOSIS — Z1211 Encounter for screening for malignant neoplasm of colon: Secondary | ICD-10-CM

## 2023-01-14 DIAGNOSIS — K219 Gastro-esophageal reflux disease without esophagitis: Secondary | ICD-10-CM

## 2023-01-14 NOTE — Patient Instructions (Addendum)
Please take your proton pump inhibitor medication, protonix  Please take this medication 30 minutes to 1 hour before meals- this makes it more effective.  Avoid spicy and acidic foods Avoid fatty foods Limit your intake of coffee, tea, alcohol, and carbonated drinks Work to maintain a healthy weight Keep the head of the bed elevated at least 3 inches with blocks or a wedge pillow if you are having any nighttime symptoms Stay upright for 2 hours after eating Avoid meals and snacks three to four hours before bedtime  You have been scheduled for an endoscopy. Please follow written instructions given to you at your visit today. If you use inhalers (even only as needed), please bring them with you on the day of your procedure.   Due to recent changes in healthcare laws, you may see the results of your imaging and laboratory studies on MyChart before your provider has had a chance to review them.  We understand that in some cases there may be results that are confusing or concerning to you. Not all laboratory results come back in the same time frame and the provider may be waiting for multiple results in order to interpret others.  Please give [place] 48 hours in order for your provider to thoroughly review all the results before contacting the office for clarification of your results.   _______________________________________________________  If your blood pressure at your visit was 140/90 or greater, please contact your primary care physician to follow up on this.  _______________________________________________________  If you are age 21 or older, your body mass index should be between 23-30. Your Body mass index is 26.98 kg/m. If this is out of the aforementioned range listed, please consider follow up with your Primary Care Provider.  If you are age 80 or younger, your body mass index should be between 19-25. Your Body mass index is 26.98 kg/m. If this is out of the aformentioned range listed,  please consider follow up with your Primary Care Provider.   ________________________________________________________  The [place] [facility] providers would like to encourage you to use [facility] to communicate with providers for non-urgent requests or questions.  Due to long hold times on the telephone, sending your provider a message by [facility] may be a faster and more efficient way to get a response.  Please allow 48 business hours for a response.  Please remember that this is for non-urgent requests.  _______________________________________________________   I appreciate the  opportunity to care for you  Thank You   [facility]

## 2023-01-14 NOTE — Progress Notes (Signed)
I agree with the assessment and plan as outlined by Ms. Collier. 

## 2023-02-10 ENCOUNTER — Ambulatory Visit (AMBULATORY_SURGERY_CENTER): Payer: BC Managed Care – PPO | Admitting: Internal Medicine

## 2023-02-10 ENCOUNTER — Encounter: Payer: Self-pay | Admitting: Internal Medicine

## 2023-02-10 VITALS — BP 122/60 | HR 64 | Temp 98.0°F | Resp 16 | Ht 62.0 in | Wt 147.0 lb

## 2023-02-10 DIAGNOSIS — K219 Gastro-esophageal reflux disease without esophagitis: Secondary | ICD-10-CM

## 2023-02-10 DIAGNOSIS — K2900 Acute gastritis without bleeding: Secondary | ICD-10-CM

## 2023-02-10 DIAGNOSIS — K319 Disease of stomach and duodenum, unspecified: Secondary | ICD-10-CM | POA: Diagnosis not present

## 2023-02-10 DIAGNOSIS — K2289 Other specified disease of esophagus: Secondary | ICD-10-CM | POA: Diagnosis not present

## 2023-02-10 DIAGNOSIS — K259 Gastric ulcer, unspecified as acute or chronic, without hemorrhage or perforation: Secondary | ICD-10-CM

## 2023-02-10 MED ORDER — PANTOPRAZOLE SODIUM 40 MG PO TBEC: 40.0000 mg | DELAYED_RELEASE_TABLET | Freq: Every day | ORAL | 4 refills | Status: DC

## 2023-02-10 MED ORDER — SODIUM CHLORIDE 0.9 % IV SOLN: 500.0000 mL | Freq: Once | INTRAVENOUS | Status: DC

## 2023-02-10 NOTE — Patient Instructions (Addendum)
Resume previous medications.  Await results for final recommendations.  Handouts on findings given to patient.                              - Discharge patient to home (with escort).                           - Await pathology results.                           - Use Protonix (pantoprazole) 40 mg PO daily for 8                            weeks.                           - Return to GI clinic in 2-3 months or next                            available.                           - The findings and recommendations were discussed                            with the patient.  YOU HAD AN ENDOSCOPIC PROCEDURE TODAY AT THE [facility]:   Refer to the procedure report that was given to you for any specific questions about what was found during the examination.  If the procedure report does not answer your questions, please call your gastroenterologist to clarify.  If you requested that your care partner not be given the details of your procedure findings, then the procedure report has been included in a sealed envelope for you to review at your convenience later.  YOU SHOULD EXPECT: Some feelings of bloating in the abdomen. Passage of more gas than usual.  Walking can help get rid of the air that was put into your GI tract during the procedure and reduce the bloating. If you had a lower endoscopy (such as a colonoscopy or flexible sigmoidoscopy) you may notice spotting of blood in your stool or on the toilet paper. If you underwent a bowel prep for your procedure, you may not have a normal bowel movement for a few days.  Please Note:  You might notice some irritation and congestion in your nose or some drainage.  This is from the oxygen used during your procedure.  There is no need for concern and it should clear up in a day or so.  SYMPTOMS TO REPORT IMMEDIATELY:   Following upper endoscopy (EGD)  Vomiting of blood or coffee ground material  New chest pain or pain under the shoulder  blades  Painful or persistently difficult swallowing  New shortness of breath  Fever of 100F or higher  Black, tarry-looking stools  For urgent or emergent issues, a gastroenterologist can be reached at any hour by calling [phone]. Do not use MyChart messaging for urgent concerns.    DIET:  We do recommend a small meal at first, but then you may proceed to your regular diet.  Drink plenty of fluids but you should avoid  alcoholic beverages for 24 hours.  ACTIVITY:  You should plan to take it easy for the rest of today and you should NOT DRIVE or use heavy machinery until tomorrow (because of the sedation medicines used during the test).    FOLLOW UP: Our staff will call the number listed on your records the next business day following your procedure.  We will call around 7:15- 8:00 am to check on you and address any questions or concerns that you may have regarding the information given to you following your procedure. If we do not reach you, we will leave a message.     If any biopsies were taken you will be contacted by phone or by letter within the next 1-3 weeks.  Please call us at [phone] if you have not heard about the biopsies in 3 weeks.    SIGNATURES/CONFIDENTIALITY: You and/or your care partner have signed paperwork which will be entered into your electronic medical record.  These signatures attest to the fact that that the information above on your After Visit Summary has been reviewed and is understood.  Full responsibility of the confidentiality of this discharge information lies with you and/or your care-partner.

## 2023-02-10 NOTE — Progress Notes (Signed)
Vss nad trans to pacu 

## 2023-02-10 NOTE — Op Note (Signed)
[facility] Patient Name: Andrea Reyes Procedure Date: 02/10/2023 10:41 AM MRN: [id number] Endoscopist: Madelyn Brunner [place] , , [id number] Age: 52 Referring MD:  Date of Birth: November 26, 1970 Gender: Female Account #: [id number] Procedure:                Upper GI endoscopy Indications:              Heartburn Medicines:                Monitored Anesthesia Care Procedure:                Pre-Anesthesia Assessment:                           - Prior to the procedure, a History and Physical                            was performed, and patient medications and                            allergies were reviewed. The patient's tolerance of                            previous anesthesia was also reviewed. The risks                            and benefits of the procedure and the sedation                            options and risks were discussed with the patient.                            All questions were answered, and informed consent                            was obtained. Prior Anticoagulants: The patient has                            taken no anticoagulant or antiplatelet agents. ASA                            Grade Assessment: II - A patient with mild systemic                            disease. After reviewing the risks and benefits,                            the patient was deemed in satisfactory condition to                            undergo the procedure.                           After obtaining informed consent, the endoscope was  passed under direct vision. Throughout the                            procedure, the patient's blood pressure, pulse, and                            oxygen saturations were monitored continuously. The                            GIF [id number] #[id number] was introduced through the                            mouth, and advanced to the second part of duodenum.                            The upper GI endoscopy was accomplished  without                            difficulty. The patient tolerated the procedure                            well. Scope In: Scope Out: Findings:                 Biopsies were taken with a cold forceps in the                            proximal esophagus and in the distal esophagus for                            histology.                           A non-obstructing Schatzki ring was found at the                            gastroesophageal junction.                           A small sliding hiatal hernia was present.                           Localized mild inflammation characterized by                            congestion (edema), erosions and erythema was found                            in the gastric antrum. Biopsies were taken with a                            cold forceps for histology.                           The examined duodenum was normal. Complications:  No immediate complications. Estimated Blood Loss:     Estimated blood loss was minimal. Impression:               - Non-obstructing Schatzki ring.                           - Small hiatal hernia.                           - Gastritis. Biopsied.                           - Normal examined duodenum.                           - Biopsies were taken with a cold forceps for                            histology in the proximal esophagus and in the                            distal esophagus. Recommendation:           - Discharge patient to home (with escort).                           - Await pathology results.                           - Use Protonix (pantoprazole) 40 mg PO daily for 8                            weeks.                           - Return to GI clinic in 2-3 months or next                            available.                           - The findings and recommendations were discussed                            with the patient. Dr Particia Lather "Alan Ripper" Leonides Schanz,  02/10/2023 11:01:11 AM

## 2023-02-10 NOTE — Progress Notes (Signed)
GASTROENTEROLOGY PROCEDURE H&P NOTE   Primary Care Physician: Aliene Beams, MD    Reason for Procedure:   GERD  Plan:    EGD  Patient is appropriate for endoscopic procedure(s) in the ambulatory ([facility]) setting.  The nature of the procedure, as well as the risks, benefits, and alternatives were carefully and thoroughly reviewed with the patient. Ample time for discussion and questions allowed. The patient understood, was satisfied, and agreed to proceed.     HPI: Andrea Reyes is a 52 y.o. female who presents for EGD for evaluation of GERD .  Patient was most recently seen in the Gastroenterology Clinic on 01/14/23.  No interval change in medical history since that appointment. Please refer to that note for full details regarding GI history and clinical presentation.   Past Medical History:  Diagnosis Date   Allergy    Anemia    Anxiety    Arthritis    Depression    GERD (gastroesophageal reflux disease)    Hyperlipidemia     Past Surgical History:  Procedure Laterality Date   EXCISION VAGINAL CYST     SEPTOPLASTY  09/2018    Prior to Admission medications   Medication Sig Start Date End Date Taking? Authorizing Provider  B Complex-Folic Acid (B COMPLEX-VITAMIN B12 PO) Take by mouth daily.   Yes [provider]  cetirizine (ZYRTEC ALLERGY) 10 MG tablet 1 tablet   Yes [provider]  Cholecalciferol (VITAMIN D3) 50 MCG (2000 UT) capsule as directed Orally once a day   Yes [provider]  sertraline (ZOLOFT) 100 MG tablet Take 100 mg by mouth daily.   Yes [provider]  sertraline (ZOLOFT) 25 MG tablet Take 25 mg by mouth daily. 12/31/20  Yes [provider]  vitamin C (ASCORBIC ACID) 500 MG tablet Take 500 mg by mouth daily.   Yes [provider]  butalbital-acetaminophen-caffeine (FIORICET, ESGIC) [phone] MG tablet  03/21/18   [provider]  ibuprofen (ADVIL) 400 MG tablet Take by mouth.     [provider]  ondansetron (ZOFRAN) 4 MG tablet Take 1 tablet (4 mg total) by mouth every 8 (eight) hours as needed for nausea or vomiting. Patient not taking: Reported on 01/14/2023 11/16/21   Lenn Sink, DPM  oxyCODONE-acetaminophen (PERCOCET) 10-325 MG tablet Take 1 tablet by mouth every 4 (four) hours as needed for pain. Patient not taking: Reported on 01/14/2023 11/16/21   Lenn Sink, DPM    Current Outpatient Medications  Medication Sig Dispense Refill   B Complex-Folic Acid (B COMPLEX-VITAMIN B12 PO) Take by mouth daily.     cetirizine (ZYRTEC ALLERGY) 10 MG tablet 1 tablet     Cholecalciferol (VITAMIN D3) 50 MCG (2000 UT) capsule as directed Orally once a day     sertraline (ZOLOFT) 100 MG tablet Take 100 mg by mouth daily.     sertraline (ZOLOFT) 25 MG tablet Take 25 mg by mouth daily.     vitamin C (ASCORBIC ACID) 500 MG tablet Take 500 mg by mouth daily.     butalbital-acetaminophen-caffeine (FIORICET, ESGIC) 50-325-40 MG tablet   0   ibuprofen (ADVIL) 400 MG tablet Take by mouth.     ondansetron (ZOFRAN) 4 MG tablet Take 1 tablet (4 mg total) by mouth every 8 (eight) hours as needed for nausea or vomiting. (Patient not taking: Reported on 01/14/2023) 20 tablet 0   oxyCODONE-acetaminophen (PERCOCET) 10-325 MG tablet Take 1 tablet by mouth every 4 (four) hours  as needed for pain. (Patient not taking: Reported on 01/14/2023) 20 tablet 0   Current Facility-Administered Medications  Medication Dose Route Frequency Provider Last Rate Last Admin   0.9 %  sodium chloride infusion  500 mL Intravenous Once Tressia Danas, MD       0.9 %  sodium chloride infusion  500 mL Intravenous Once Imogene Burn, MD        Allergies as of 02/10/2023 - Review Complete 02/10/2023  Allergen Reaction Noted   Erythromycin Rash and Other (See Comments) 03/28/2012    Family History  Problem Relation Age of Onset   Allergic rhinitis Neg Hx    Asthma Neg Hx    Eczema Neg Hx     Urticaria Neg Hx    Colon cancer Neg Hx    Colon polyps Neg Hx    Esophageal cancer Neg Hx    Rectal cancer Neg Hx    Stomach cancer Neg Hx     Social History   Socioeconomic History   Marital status: Married    Spouse name: Not on file   Number of children: Not on file   Years of education: Not on file   Highest education level: Not on file  Occupational History   Not on file  Tobacco Use   Smoking status: Never   Smokeless tobacco: Never  Vaping Use   Vaping status: Never Used  Substance and Sexual Activity   Alcohol use: Yes    Comment: once a week   Drug use: Never   Sexual activity: Not on file  Other Topics Concern   Not on file  Social History Narrative   Not on file   Social Determinants of Health   Financial Resource Strain: Not on file  Food Insecurity: Not on file  Transportation Needs: Not on file  Physical Activity: Not on file  Stress: Not on file  Social Connections: Not on file  Intimate Partner Violence: Not on file    Physical Exam: Vital signs in last 24 hours: BP 103/67   Pulse 62   Temp 98 F (36.7 C)   Ht 5\' 2"  (1.575 m)   Wt 147 lb (66.7 kg)   SpO2 98%   BMI 26.89 kg/m  GEN: NAD EYE: Sclerae anicteric ENT: MMM CV: Non-tachycardic Pulm: No increased WOB GI: Soft NEURO:  Alert & Oriented   Eulah Pont, MD [facility]   02/10/2023 10:08 AM

## 2023-02-10 NOTE — Progress Notes (Signed)
Called to room to assist during endoscopic procedure.  Patient ID and intended procedure confirmed with present staff. Received instructions for my participation in the procedure from the performing physician.  

## 2023-02-11 ENCOUNTER — Telehealth: Payer: Self-pay

## 2023-02-11 DIAGNOSIS — Z0000 Encounter for general adult medical examination without abnormal findings: Secondary | ICD-10-CM | POA: Diagnosis not present

## 2023-02-11 DIAGNOSIS — Z79899 Other long term (current) drug therapy: Secondary | ICD-10-CM | POA: Diagnosis not present

## 2023-02-11 DIAGNOSIS — E538 Deficiency of other specified B group vitamins: Secondary | ICD-10-CM | POA: Diagnosis not present

## 2023-02-11 NOTE — Telephone Encounter (Signed)
  Follow up Call-     02/10/2023    9:39 AM 01/29/2021   11:04 AM  Call back number  Post procedure Call Back phone  # [phone]  Permission to leave phone message Yes Yes     Patient questions:  Do you have a fever, pain , or abdominal swelling? No. Pain Score  0 *  Have you tolerated food without any problems? Yes.    Have you been able to return to your normal activities? Yes.    Do you have any questions about your discharge instructions: Diet   No. Medications  No. Follow up visit  No.  Do you have questions or concerns about your Care? No.  Actions: * If pain score is 4 or above: No action needed, pain <4.

## 2023-02-15 ENCOUNTER — Encounter: Payer: Self-pay | Admitting: Internal Medicine

## 2023-02-28 DIAGNOSIS — F419 Anxiety disorder, unspecified: Secondary | ICD-10-CM | POA: Diagnosis not present

## 2023-03-04 ENCOUNTER — Telehealth: Payer: Self-pay | Admitting: Internal Medicine

## 2023-03-04 NOTE — Telephone Encounter (Signed)
PT is concerned that her symptoms are getting worse. The pantoprazole is not helping at all. She thinks her medications need to be increased. She has been very hoarse lately and phlegm in throat with soreness. Yesterday she had an episode with her gerd and wants to discuss that further as well. Please advise.

## 2023-03-04 NOTE — Telephone Encounter (Signed)
Patient called to f/u advised her once provider responds we will reach back with her.

## 2023-03-07 ENCOUNTER — Ambulatory Visit (INDEPENDENT_AMBULATORY_CARE_PROVIDER_SITE_OTHER): Payer: BC Managed Care – PPO | Admitting: Internal Medicine

## 2023-03-07 ENCOUNTER — Encounter: Payer: Self-pay | Admitting: Internal Medicine

## 2023-03-07 ENCOUNTER — Other Ambulatory Visit: Payer: Self-pay

## 2023-03-07 ENCOUNTER — Telehealth: Payer: Self-pay

## 2023-03-07 VITALS — BP 98/60 | HR 78 | Ht 62.0 in | Wt 147.1 lb

## 2023-03-07 DIAGNOSIS — K219 Gastro-esophageal reflux disease without esophagitis: Secondary | ICD-10-CM

## 2023-03-07 MED ORDER — PANTOPRAZOLE SODIUM 40 MG PO TBEC: 40.0000 mg | DELAYED_RELEASE_TABLET | Freq: Two times a day (BID) | ORAL | 4 refills | Status: DC

## 2023-03-07 NOTE — Patient Instructions (Addendum)
You have been scheduled for an endoscopy. Please follow written instructions given to you at your visit today.  If you use inhalers (even only as needed), please bring them with you on the day of your procedure.  If you take any of the following medications, they will need to be adjusted prior to your procedure:   DO NOT TAKE 7 DAYS PRIOR TO TEST- Trulicity (dulaglutide) Ozempic, Wegovy (semaglutide) Mounjaro (tirzepatide) Bydureon Bcise (exanatide extended release)  DO NOT TAKE 1 DAY PRIOR TO YOUR TEST Rybelsus (semaglutide) Adlyxin (lixisenatide) Victoza (liraglutide) Byetta (exanatide) ___________________________________________________________________________     If your blood pressure at your visit was 140/90 or greater, please contact your primary care physician to follow up on this.  _______________________________________________________  If you are age 53 or older, your body mass index should be between 23-30. Your Body mass index is 26.91 kg/m. If this is out of the aforementioned range listed, please consider follow up with your Primary Care Provider.  If you are age 53 or younger, your body mass index should be between 19-25. Your Body mass index is 26.91 kg/m. If this is out of the aformentioned range listed, please consider follow up with your Primary Care Provider.   ________________________________________________________  The [place] [facility] providers would like to encourage you to use [facility] to communicate with providers for non-urgent requests or questions.  Due to long hold times on the telephone, sending your provider a message by [facility] may be a faster and more efficient way to get a response.  Please allow 48 business hours for a response.  Please remember that this is for non-urgent requests.  _______________________________________________________  Due to recent changes in healthcare laws, you may see the results of your imaging and laboratory studies on  MyChart before your provider has had a chance to review them.  We understand that in some cases there may be results that are confusing or concerning to you. Not all laboratory results come back in the same time frame and the provider may be waiting for multiple results in order to interpret others.  Please give [place] 48 hours in order for your provider to thoroughly review all the results before contacting the office for clarification of your results.    Thank you for entrusting me with your care and for choosing [facility], Dr. Eulah Pont

## 2023-03-07 NOTE — Progress Notes (Signed)
03/07/2023 Andrea Reyes [id number] 1970-08-08  Referring provider: Aliene Beams, MD  ASSESSMENT AND PLAN:   Gastroesophageal reflux disease Esophageal eosinophilia Patient presents for follow-up of acid reflux, which could be an explanation for the patient's hoarseness.  Will have her increase her pantoprazole from once a day to twice a day to see if this helps control her symptoms better.  Her recent EGD did show significant numbers of eosinophils on esophageal biopsies, which could suggest either uncontrolled GERD versus eosinophilic esophagitis.  If patient still has significant numbers of eosinophils on her EGD in 2 months after being on twice daily PPI therapy, then would recommend other treatments for eosinophilic esophagitis. -Follow a GERD friendly diet -Increase pantoprazole 40 mg from every day to BID -EGD [facility] in 2 months  Special screening for malignant neoplasms, colon Recall 2032   Patient Care Team: Aliene Beams, MD as PCP - General (Family Medicine)  HISTORY OF PRESENT ILLNESS: 52 y.o. female with a past medical history of allergies, acoustic neuroma,and others listed below presents for evaluation of dyspepsia.   Interval History: She has been having issues with hoarseness. She had a good response to pantoprazole therapy in the past (before she had her EGD done). Right after the endoscopy, she had a sore throat. The area that feels irritated has not gone away. This past Thursday, she laid down to take a nap and then woke up. When she got up, she felt dizziness. She feels like she has stayed well hydrated. After her EGD, she did not feel like the pantoprazole has worked as well. She feels like her diet has changed over time she is consuming more dairy, eggs, and fish.  Colonoscopy 01/29/21: - The entire examined colon is normal on direct and retroflexion views. - Repeat colonoscopy recommended in 10 years  EGD 02/10/23:  Path: 1. Surgical [P], gastric bx -  MILD REACTIVE GASTROPATHY. - NEGATIVE FOR H. PYLORI ON H&E STAIN - NO INTESTINAL METAPLASIA, DYSPLASIA, OR MALIGNANCY. 2. Surgical [P], distal esophagus bx - BENIGN SQUAMOUS MUCOSA WITH REFLUX CHANGES AND INCREASED INTRAEPITHELIAL EOSINOPHILS (UP TO 820 EOSINOPHILS PER HIGH-POWER FIELD). SEE NOTE. 3. Surgical [P], proximal esophagus bx - BENIGN SQUAMOUS MUCOSA WITH INCREASED INTRAEPITHELIAL EOSINOPHILS (UP TO 10 EOSINOPHILS PER HIGH-POWER FIELD). SEE NOTE. Diagnosis Note 3. - Diagnostic considerations include gastroesophageal reflux disease and eosinophilic esophagitis, with GERD being favored. Clinical and endoscopic correlation is recommended.  RELEVANT LABS AND IMAGING: CBC    Component Value Date/Time   WBC 6.1 09/08/2010 1146   RBC 4.58 09/08/2010 1146   HGB 12.4 09/08/2010 1146   HCT 38.4 09/08/2010 1146   PLT 259 09/08/2010 1146   MCV 83.8 09/08/2010 1146   MCH 27.1 09/08/2010 1146   MCHC 32.3 09/08/2010 1146   RDW 15.3 09/08/2010 1146      Current Medications:       Current Outpatient Medications (Respiratory):    cetirizine (ZYRTEC ALLERGY) 10 MG tablet, 1 tablet   Current Outpatient Medications (Analgesics):    butalbital-acetaminophen-caffeine (FIORICET, ESGIC) 50-325-40 MG tablet,    ibuprofen (ADVIL) 400 MG tablet, Take by mouth.   oxyCODONE-acetaminophen (PERCOCET) 10-325 MG tablet, Take 1 tablet by mouth every 4 (four) hours as needed for pain. (Patient not taking: Reported on 01/14/2023)     Current Outpatient Medications (Other):    B Complex-Folic Acid (B COMPLEX-VITAMIN B12 PO), Take by mouth daily.   Cholecalciferol (VITAMIN D3) 50 MCG (2000 UT) capsule, as directed Orally once a day  ondansetron (ZOFRAN) 4 MG tablet, Take 1 tablet (4 mg total) by mouth every 8 (eight) hours as needed for nausea or vomiting. (Patient not taking: Reported on 01/14/2023)   pantoprazole (PROTONIX) 40 MG tablet, Take 1 tablet (40 mg total) by mouth 2 (two) times  daily.   sertraline (ZOLOFT) 100 MG tablet, Take 100 mg by mouth daily.   sertraline (ZOLOFT) 25 MG tablet, Take 25 mg by mouth daily.   vitamin C (ASCORBIC ACID) 500 MG tablet, Take 500 mg by mouth daily.  Current Facility-Administered Medications (Other):    0.9 %  sodium chloride infusion  Medical History:  Past Medical History:  Diagnosis Date   Allergy    Anemia    Anxiety    Arthritis    Depression    GERD (gastroesophageal reflux disease)    Hyperlipidemia    Allergies:  Allergies  Allergen Reactions   Erythromycin Rash and Other (See Comments)     Surgical History:  She  has a past surgical history that includes Septoplasty (09/2018) and Excision vaginal cyst. Family History:  Her family history is not on file.  PHYSICAL EXAM: Ht 5\' 2"  (1.575 m)   Wt 147 lb 2 oz (66.7 kg)   BMI 26.91 kg/m  General Appearance: Well nourished, in no apparent distress. Head:   Normocephalic and atraumatic. Respiratory: Respiratory effort normal Cardio: Regular rate Abdomen: Soft,  Non-distended ,active bowel sounds. No tenderness  Neuro: Alert and  oriented x4;  No focal deficits. Psych:  Cooperative. Normal mood and affect.    Imogene Burn, MD 1:38 PM  I spent 33 minutes of time, including in depth chart review, independent review of results as outlined above, communicating results with the patient directly, face-to-face time with the patient, coordinating care, and ordering studies and medications as appropriate, and documentation.

## 2023-03-07 NOTE — Telephone Encounter (Signed)
Notified patient Dr Leonides Schanz said its okay to come in today at 1:30 pm. Patient stated she will be here and is happy we can accommodate her.

## 2023-03-21 DIAGNOSIS — H16223 Keratoconjunctivitis sicca, not specified as Sjogren's, bilateral: Secondary | ICD-10-CM | POA: Diagnosis not present

## 2023-03-24 DIAGNOSIS — J0101 Acute recurrent maxillary sinusitis: Secondary | ICD-10-CM | POA: Diagnosis not present

## 2023-03-24 DIAGNOSIS — F419 Anxiety disorder, unspecified: Secondary | ICD-10-CM | POA: Diagnosis not present

## 2023-03-24 DIAGNOSIS — J343 Hypertrophy of nasal turbinates: Secondary | ICD-10-CM | POA: Diagnosis not present

## 2023-04-06 DIAGNOSIS — F419 Anxiety disorder, unspecified: Secondary | ICD-10-CM | POA: Diagnosis not present

## 2023-04-15 ENCOUNTER — Other Ambulatory Visit: Payer: Self-pay | Admitting: Internal Medicine

## 2023-04-15 DIAGNOSIS — K219 Gastro-esophageal reflux disease without esophagitis: Secondary | ICD-10-CM

## 2023-04-15 NOTE — Telephone Encounter (Signed)
PT is calling to have a refill for protonix sent to her pharmacy. She is asking for a 90 day supply. Please advise.

## 2023-04-20 ENCOUNTER — Telehealth: Payer: Self-pay | Admitting: Internal Medicine

## 2023-04-20 ENCOUNTER — Telehealth: Payer: Self-pay

## 2023-04-20 NOTE — Telephone Encounter (Signed)
Called the patient to verify pharmacy and present dosage of her pantoprazole. No answer. Message left of my call. Also advised patient on this message that I would send a message through My Chart in case that was easier for her.

## 2023-04-20 NOTE — Telephone Encounter (Signed)
Communicating with the patient through My Chart.  She is down to 5 pantoprazole tablets. Her insurance is requiring a prior authorization for twice daily dosing. I have alerted the pharmacy team to start a prior authorization. Despite this, the patient will run out of medication before the insurance responds, most likely. I will keep you up to date as this develops.  Her diagnosis is GERD with esophagitis. Is it also eosinophilic esophagitis?

## 2023-04-20 NOTE — Telephone Encounter (Signed)
The patient has only 5 tablets of pantoprazole left. Would you start a prior authorization for her. The diagnosis is GERD esophagitis and if you look at the pathology report, you can use eosinophilic esophagitis as well.   Thank you

## 2023-04-20 NOTE — Telephone Encounter (Signed)
Inbound call from patient needs a prior authorization sent for Pantoprazole, and also wants to see if dose can be increased.

## 2023-04-21 ENCOUNTER — Telehealth: Payer: Self-pay

## 2023-04-21 ENCOUNTER — Other Ambulatory Visit (HOSPITAL_COMMUNITY): Payer: Self-pay

## 2023-04-21 NOTE — Telephone Encounter (Signed)
Situation with [employer] prior authorization requirements explained to the patient. She agrees to purchase OTC omeprazole.

## 2023-04-21 NOTE — Telephone Encounter (Signed)
Pharmacy Patient Advocate Encounter   Received notification from RX Request Messages that prior authorization for Pantoprazole Sodium 40 mg tablets is required/requested.   Insurance verification completed.   The patient is insured through [facility] .   Per test claim: PA required; PA submitted to [employer] via CoverMyMeds Key/confirmation #/EOC [id number] Status is pending

## 2023-04-21 NOTE — Telephone Encounter (Signed)
I see where Dr. Leonides Schanz has given patient instruction to purchase Omeprazole otc. Per insurance she has to try and fail at least 2 of the following:

## 2023-04-21 NOTE — Telephone Encounter (Signed)
PA request has been Submitted. New Encounter created for follow up. For additional info see Pharmacy Prior Auth telephone encounter from 04/21/2023.

## 2023-04-21 NOTE — Telephone Encounter (Signed)
Unable to find pantoprazole OTC. Available are esomeprazole, omeprazole and lansoprazole.

## 2023-04-21 NOTE — Telephone Encounter (Signed)
Called to discuss with the patient.

## 2023-04-21 NOTE — Telephone Encounter (Signed)
I apologize. I can not speak for what the plan covered prior to today. Formulary list change often with plans, as I am trying to complete prior auth for the pantoprazole these are the questions being asked. All I can try to do is verify. Has the patient tried and failed 2 of the below listed medications?

## 2023-04-22 NOTE — Telephone Encounter (Signed)
The patient plans to pay out of pocket for the pantoprazole. She will be taking it BID.  Our pharmacy team is pursuing the PA.

## 2023-04-25 NOTE — Telephone Encounter (Signed)
Pharmacy Patient Advocate Encounter  Received notification from [facility] that Prior Authorization for Pantoprazole Sodium 40MG  dr tablets has been DENIED.  Full denial letter will be uploaded to the media tab. See denial reason below.  This medication is covered when two over-the-counter (OTC) generic proton pump inhibitors (such as omeprazole, lansoprazole) have been tried and did not work, or when the member cannot take all other OTC generic proton pump inhibitor medications.  In this case, none of the alternative medications have been tried. Alternative medications include: Lansoprazole, Omeprazole, Esomeprazole, etc Please note: the alternative medications are over-the-counter and are not covered by pharmacy benefits  PA #/Case ID/Reference #: [id number]  Please be advised we currently do not have a Pharmacist to review denials, therefore you will need to process appeals accordingly as needed. Thanks for your support at this time. Contact for appeals are as follows: Phone: xxx, Fax: xxx  Appeals must be in writing or have an appeal form on [employer] site sent. Further information in attached letter.

## 2023-04-29 ENCOUNTER — Encounter: Payer: Self-pay | Admitting: Internal Medicine

## 2023-05-12 ENCOUNTER — Ambulatory Visit: Payer: BC Managed Care – PPO | Admitting: Internal Medicine

## 2023-05-12 ENCOUNTER — Encounter: Payer: Self-pay | Admitting: Internal Medicine

## 2023-05-12 VITALS — BP 115/73 | HR 63 | Temp 97.7°F | Resp 10 | Ht 62.0 in | Wt 147.0 lb

## 2023-05-12 DIAGNOSIS — K222 Esophageal obstruction: Secondary | ICD-10-CM | POA: Diagnosis not present

## 2023-05-12 DIAGNOSIS — K219 Gastro-esophageal reflux disease without esophagitis: Secondary | ICD-10-CM | POA: Diagnosis not present

## 2023-05-12 DIAGNOSIS — K200 Eosinophilic esophagitis: Secondary | ICD-10-CM

## 2023-05-12 DIAGNOSIS — F419 Anxiety disorder, unspecified: Secondary | ICD-10-CM | POA: Diagnosis not present

## 2023-05-12 DIAGNOSIS — K2289 Other specified disease of esophagus: Secondary | ICD-10-CM | POA: Diagnosis not present

## 2023-05-12 MED ORDER — FAMOTIDINE 20 MG PO TABS: 20.0000 mg | ORAL_TABLET | Freq: Every day | ORAL | 0 refills | Status: DC

## 2023-05-12 MED ORDER — SODIUM CHLORIDE 0.9 % IV SOLN: 500.0000 mL | Freq: Once | INTRAVENOUS | Status: DC

## 2023-05-12 NOTE — Progress Notes (Signed)
GASTROENTEROLOGY PROCEDURE H&P NOTE   Primary Care Physician: Aliene Beams, MD    Reason for Procedure:   Esophageal eosinophilia, GERD  Plan:    EGD  Patient is appropriate for endoscopic procedure(s) in the ambulatory ([facility]) setting.  The nature of the procedure, as well as the risks, benefits, and alternatives were carefully and thoroughly reviewed with the patient. Ample time for discussion and questions allowed. The patient understood, was satisfied, and agreed to proceed.     HPI: Andrea Reyes is a 52 y.o. female who presents for EGD for evaluation of esophageal eosinophilia.  Patient was most recently seen in the Gastroenterology Clinic on 03/07/23.  She has been taking her pantoprazole twice daily and has seen improvement in her chest burning. However, she still feels phlegm in throat at times, particularly at night. One of her friends did tell her that she has a hoarse voice normally. Please refer to that note for full details regarding GI history and clinical presentation.   Past Medical History:  Diagnosis Date   Allergy    Anemia    Anxiety    Arthritis    Depression    GERD (gastroesophageal reflux disease)    Hyperlipidemia     Past Surgical History:  Procedure Laterality Date   COLONOSCOPY     EXCISION VAGINAL CYST     SEPTOPLASTY  09/2018   UPPER GASTROINTESTINAL ENDOSCOPY      Prior to Admission medications   Medication Sig Start Date End Date Taking? Authorizing Provider  Calcium Carbonate (CALCIUM 600 PO) Take 1 tablet by mouth daily.   Yes [provider]  cetirizine (ZYRTEC ALLERGY) 10 MG tablet 10 mg daily as needed.   Yes [provider]  Cholecalciferol (VITAMIN D3) 50 MCG (2000 UT) capsule as directed Orally once a day   Yes [provider]  fluorometholone (FML) 0.1 % ophthalmic suspension Place 1 drop into both eyes as needed. 03/23/23  Yes [provider]  fluticasone (FLONASE) 50 MCG/ACT nasal spray  Place 1 spray into both nostrils daily.   Yes [provider]  pantoprazole (PROTONIX) 40 MG tablet Take 1 tablet (40 mg total) by mouth 2 (two) times daily. 04/15/23 07/14/23 Yes Imogene Burn, MD  RESTASIS 0.05 % ophthalmic emulsion Place 1 drop into both eyes 2 (two) times daily. 04/20/23  Yes [provider]  sertraline (ZOLOFT) 100 MG tablet Take 100 mg by mouth daily.   Yes [provider]  sertraline (ZOLOFT) 25 MG tablet Take 25 mg by mouth daily. 12/31/20  Yes [provider]  vitamin B-12 (CYANOCOBALAMIN) 500 MCG tablet Take 500 mcg by mouth daily.   Yes [provider]  butalbital-acetaminophen-caffeine (FIORICET, ESGIC) [phone] MG tablet  03/21/18   [provider]  Calcium Carbonate Antacid (TUMS PO) Take 1,000-2,000 mg by mouth as needed.    [provider]  ibuprofen (ADVIL) 400 MG tablet Take 400 mg by mouth as needed.    [provider]    Current Outpatient Medications  Medication Sig Dispense Refill   Calcium Carbonate (CALCIUM 600 PO) Take 1 tablet by mouth daily.     cetirizine (ZYRTEC ALLERGY) 10 MG tablet 10 mg daily as needed.     Cholecalciferol (VITAMIN D3) 50 MCG (2000 UT) capsule as directed Orally once a day     fluorometholone (FML) 0.1 % ophthalmic suspension Place 1 drop into both eyes as needed.     fluticasone (FLONASE) 50 MCG/ACT nasal spray  Place 1 spray into both nostrils daily.     pantoprazole (PROTONIX) 40 MG tablet Take 1 tablet (40 mg total) by mouth 2 (two) times daily. 180 tablet 0   RESTASIS 0.05 % ophthalmic emulsion Place 1 drop into both eyes 2 (two) times daily.     sertraline (ZOLOFT) 100 MG tablet Take 100 mg by mouth daily.     sertraline (ZOLOFT) 25 MG tablet Take 25 mg by mouth daily.     vitamin B-12 (CYANOCOBALAMIN) 500 MCG tablet Take 500 mcg by mouth daily.     butalbital-acetaminophen-caffeine (FIORICET, ESGIC) 50-325-40 MG tablet   0   Calcium Carbonate Antacid  (TUMS PO) Take 1,000-2,000 mg by mouth as needed.     ibuprofen (ADVIL) 400 MG tablet Take 400 mg by mouth as needed.     Current Facility-Administered Medications  Medication Dose Route Frequency Provider Last Rate Last Admin   0.9 %  sodium chloride infusion  500 mL Intravenous Once Tressia Danas, MD       0.9 %  sodium chloride infusion  500 mL Intravenous Once Imogene Burn, MD        Allergies as of 05/12/2023 - Review Complete 05/12/2023  Allergen Reaction Noted   Erythromycin Rash and Other (See Comments) 03/28/2012    Family History  Problem Relation Age of Onset   Allergic rhinitis Neg Hx    Asthma Neg Hx    Eczema Neg Hx    Urticaria Neg Hx    Colon cancer Neg Hx    Colon polyps Neg Hx    Esophageal cancer Neg Hx    Rectal cancer Neg Hx    Stomach cancer Neg Hx     Social History   Socioeconomic History   Marital status: Married    Spouse name: Not on file   Number of children: Not on file   Years of education: Not on file   Highest education level: Not on file  Occupational History   Not on file  Tobacco Use   Smoking status: Never   Smokeless tobacco: Never  Vaping Use   Vaping status: Never Used  Substance and Sexual Activity   Alcohol use: Yes    Comment: once a week   Drug use: Never   Sexual activity: Not on file  Other Topics Concern   Not on file  Social History Narrative   Not on file   Social Determinants of Health   Financial Resource Strain: Not on file  Food Insecurity: Not on file  Transportation Needs: Not on file  Physical Activity: Not on file  Stress: Not on file  Social Connections: Not on file  Intimate Partner Violence: Not on file    Physical Exam: Vital signs in last 24 hours: BP 105/63   Pulse (!) 58   Temp 97.7 F (36.5 C)   Resp 12   Ht 5\' 2"  (1.575 m)   Wt 147 lb (66.7 kg)   LMP 10/15/2020 Comment: irregular  SpO2 97%   BMI 26.89 kg/m  GEN: NAD EYE: Sclerae anicteric ENT: MMM CV:  Non-tachycardic Pulm: No increased WOB GI: Soft NEURO:  Alert & Oriented   Eulah Pont, MD [facility]   05/12/2023 8:14 AM

## 2023-05-12 NOTE — Progress Notes (Signed)
Uneventful anesthetic. Report to pacu rn. Vss. Care resumed by rn. 

## 2023-05-12 NOTE — Op Note (Signed)
[facility] Patient Name: Andrea Reyes Procedure Date: 05/12/2023 8:07 AM MRN: [id number] Endoscopist: Madelyn Brunner [place] , , [id number] Age: 52 Referring MD:  Date of Birth: 1971-03-18 Gender: Female Account #: [id number] Procedure:                Upper GI endoscopy Indications:              Heartburn, Follow-up of esophageal eosinophilia Medicines:                Monitored Anesthesia Care Procedure:                Pre-Anesthesia Assessment:                           - Prior to the procedure, a History and Physical                            was performed, and patient medications and                            allergies were reviewed. The patient's tolerance of                            previous anesthesia was also reviewed. The risks                            and benefits of the procedure and the sedation                            options and risks were discussed with the patient.                            All questions were answered, and informed consent                            was obtained. Prior Anticoagulants: The patient has                            taken no anticoagulant or antiplatelet agents. ASA                            Grade Assessment: II - A patient with mild systemic                            disease. After reviewing the risks and benefits,                            the patient was deemed in satisfactory condition to                            undergo the procedure.                           After obtaining informed consent, the endoscope was  passed under direct vision. Throughout the                            procedure, the patient's blood pressure, pulse, and                            oxygen saturations were monitored continuously. The                            Olympus scope [id number] was introduced through the                            mouth, and advanced to the second part of duodenum.                             The upper GI endoscopy was accomplished without                            difficulty. The patient tolerated the procedure                            well. Scope In: Scope Out: Findings:                 A non-obstructing Schatzki ring was found at the                            gastroesophageal junction.                           Biopsies were obtained from the proximal and distal                            esophagus with cold forceps for histology of                            suspected eosinophilic esophagitis.                           A small hiatal hernia was present.                           The examined duodenum was normal. Complications:            No immediate complications. Estimated Blood Loss:     Estimated blood loss was minimal. Impression:               - Non-obstructing Schatzki ring.                           - Small hiatal hernia.                           - Normal examined duodenum.                           - Biopsies were taken with a cold forceps for  evaluation of eosinophilic esophagitis. Recommendation:           - Discharge patient to home (with escort).                           - Await pathology results.                           - Continue pantoprazole 40 mg BID for now.                           - Trial of Pepcid 20 mg QHS to see if this helps                            with phlegm at night time.                           - Return to GI clinic in 2-3 months.                           - The findings and recommendations were discussed                            with the patient. Dr Particia Lather "Alan Ripper" Leonides Schanz,  05/12/2023 8:36:44 AM

## 2023-05-12 NOTE — Progress Notes (Signed)
Called to room to assist during endoscopic procedure.  Patient ID and intended procedure confirmed with present staff. Received instructions for my participation in the procedure from the performing physician.  

## 2023-05-12 NOTE — Patient Instructions (Addendum)
Recommendation:Discharge patient to home (with escort).                           - Await pathology results.                           - Continue pantoprazole 40 mg BID for now.                           - Trial of Pepcid 20 mg QHS to see if this helps                            with phlegm at night time.                           - Return to GI clinic in 2-3 months.                           - The findings and recommendations were discussed                            with the patient.  YOU HAD AN ENDOSCOPIC PROCEDURE TODAY AT THE [facility]:   Refer to the procedure report that was given to you for any specific questions about what was found during the examination.  If the procedure report does not answer your questions, please call your gastroenterologist to clarify.  If you requested that your care partner not be given the details of your procedure findings, then the procedure report has been included in a sealed envelope for you to review at your convenience later.  YOU SHOULD EXPECT: Some feelings of bloating in the abdomen. Passage of more gas than usual.  Walking can help get rid of the air that was put into your GI tract during the procedure and reduce the bloating. If you had a lower endoscopy (such as a colonoscopy or flexible sigmoidoscopy) you may notice spotting of blood in your stool or on the toilet paper. If you underwent a bowel prep for your procedure, you may not have a normal bowel movement for a few days.  Please Note:  You might notice some irritation and congestion in your nose or some drainage.  This is from the oxygen used during your procedure.  There is no need for concern and it should clear up in a day or so.  SYMPTOMS TO REPORT IMMEDIATELY:  Following upper endoscopy (EGD)  Vomiting of blood or coffee ground material  New chest pain or pain under the shoulder blades  Painful or persistently difficult swallowing  New shortness of breath  Fever of 100F  or higher  Black, tarry-looking stools  For urgent or emergent issues, a gastroenterologist can be reached at any hour by calling [phone]. Do not use MyChart messaging for urgent concerns.    DIET:  We do recommend a small meal at first, but then you may proceed to your regular diet.  Drink plenty of fluids but you should avoid alcoholic beverages for 24 hours.  ACTIVITY:  You should plan to take it easy for the rest of today and you should NOT DRIVE or use heavy machinery until tomorrow (because of the sedation medicines used  during the test).    FOLLOW UP: Our staff will call the number listed on your records the next business day following your procedure.  We will call around 7:15- 8:00 am to check on you and address any questions or concerns that you may have regarding the information given to you following your procedure. If we do not reach you, we will leave a message.     If any biopsies were taken you will be contacted by phone or by letter within the next 1-3 weeks.  Please call us at [phone] if you have not heard about the biopsies in 3 weeks.    SIGNATURES/CONFIDENTIALITY: You and/or your care partner have signed paperwork which will be entered into your electronic medical record.  These signatures attest to the fact that that the information above on your After Visit Summary has been reviewed and is understood.  Full responsibility of the confidentiality of this discharge information lies with you and/or your care-partner.

## 2023-05-13 ENCOUNTER — Telehealth: Payer: Self-pay

## 2023-05-13 NOTE — Telephone Encounter (Signed)
Attempted f/u call. No answer, left VM. 

## 2023-05-16 ENCOUNTER — Encounter: Payer: Self-pay | Admitting: Internal Medicine

## 2023-05-16 LAB — SURGICAL PATHOLOGY

## 2023-05-18 DIAGNOSIS — H0100B Unspecified blepharitis left eye, upper and lower eyelids: Secondary | ICD-10-CM | POA: Diagnosis not present

## 2023-05-18 DIAGNOSIS — H0100A Unspecified blepharitis right eye, upper and lower eyelids: Secondary | ICD-10-CM | POA: Diagnosis not present

## 2023-05-18 DIAGNOSIS — H04123 Dry eye syndrome of bilateral lacrimal glands: Secondary | ICD-10-CM | POA: Diagnosis not present

## 2023-05-19 DIAGNOSIS — Z1231 Encounter for screening mammogram for malignant neoplasm of breast: Secondary | ICD-10-CM | POA: Diagnosis not present

## 2023-05-20 ENCOUNTER — Encounter: Payer: Self-pay | Admitting: Internal Medicine

## 2023-05-20 ENCOUNTER — Other Ambulatory Visit: Payer: Self-pay

## 2023-05-20 ENCOUNTER — Ambulatory Visit: Payer: BC Managed Care – PPO | Admitting: Internal Medicine

## 2023-05-20 DIAGNOSIS — J392 Other diseases of pharynx: Secondary | ICD-10-CM

## 2023-06-06 DIAGNOSIS — Z01419 Encounter for gynecological examination (general) (routine) without abnormal findings: Secondary | ICD-10-CM | POA: Diagnosis not present

## 2023-06-06 DIAGNOSIS — Z1331 Encounter for screening for depression: Secondary | ICD-10-CM | POA: Diagnosis not present

## 2023-06-08 DIAGNOSIS — F419 Anxiety disorder, unspecified: Secondary | ICD-10-CM | POA: Diagnosis not present

## 2023-06-14 DIAGNOSIS — E538 Deficiency of other specified B group vitamins: Secondary | ICD-10-CM | POA: Diagnosis not present

## 2023-06-14 DIAGNOSIS — J312 Chronic pharyngitis: Secondary | ICD-10-CM | POA: Diagnosis not present

## 2023-07-08 ENCOUNTER — Ambulatory Visit (INDEPENDENT_AMBULATORY_CARE_PROVIDER_SITE_OTHER): Payer: BC Managed Care – PPO | Admitting: Otolaryngology

## 2023-07-08 ENCOUNTER — Encounter (INDEPENDENT_AMBULATORY_CARE_PROVIDER_SITE_OTHER): Payer: Self-pay | Admitting: Otolaryngology

## 2023-07-08 VITALS — BP 124/81 | HR 75 | Ht 62.0 in | Wt 147.0 lb

## 2023-07-08 DIAGNOSIS — R0981 Nasal congestion: Secondary | ICD-10-CM | POA: Diagnosis not present

## 2023-07-08 DIAGNOSIS — J312 Chronic pharyngitis: Secondary | ICD-10-CM | POA: Diagnosis not present

## 2023-07-08 DIAGNOSIS — J3089 Other allergic rhinitis: Secondary | ICD-10-CM

## 2023-07-08 DIAGNOSIS — R070 Pain in throat: Secondary | ICD-10-CM

## 2023-07-08 DIAGNOSIS — K219 Gastro-esophageal reflux disease without esophagitis: Secondary | ICD-10-CM | POA: Diagnosis not present

## 2023-07-08 DIAGNOSIS — R09A2 Foreign body sensation, throat: Secondary | ICD-10-CM | POA: Diagnosis not present

## 2023-07-08 DIAGNOSIS — R0982 Postnasal drip: Secondary | ICD-10-CM

## 2023-07-08 NOTE — Patient Instructions (Signed)
[email or web address] - checkout this website for reflux diet  - Take Reflux Gourmet (natural supplement available on [employer]) to help with symptoms of chronic throat irritation    Continue Pepcid and increase it to twice daily Continue allergy pill and Flonase

## 2023-07-08 NOTE — Progress Notes (Signed)
ENT CONSULT:  Reason for Consult: regurgitation and GERD, throat discomfort    HPI: Discussed the use of AI scribe software for clinical note transcription with the patient, who gave verbal consent to proceed.  History of Present Illness   The patient is a 52 yoF, presents with a chief complaint of chronic throat irritation. The patient reports a history of gastroesophageal reflux disease (GERD), for which they underwent two endoscopies. The first endoscopy, performed in July, 2024, revealed a high level of eosinophils, raising suspicion for eosinophilic esophagitis (EOE) vs inflammation due to GERD LPR. However, a second endoscopy in October, 2024 showed normal eosinophil levels. The patient reports that the throat discomfort began immediately after the first endoscopy and has persisted since.  The patient describes the heavy sensation in her throat, constantly aware of it, especially when swallowing. The discomfort is localized on the inside right side of the throat, extending down to the chest. Prior to the first endoscopy, the patient was on pantoprazole for GERD, which was initially effective. However, after a brief discontinuation before the endoscopy, the medication was no longer effective upon resumption. The dose was increased to twice daily, which has controlled the burning sensation associated with GERD, but the throat discomfort persisted.  The patient has been adhering to an anti-reflux diet, avoiding spicy, acidic foods, and alcohol. They have also implemented lifestyle modifications such as elevating the head of the bed, not eating close to bedtime, and working out with a trainer to manage weight. Despite these measures, the throat discomfort persisted.  In addition to GERD, the patient has a history of chronic nasal congestion, for which they are currently taking Zyrtec and Flonase. They report waking up with dry, crusty nasal secretions in the morning, which have improved with the use  of Flonase (patient of Dr Suszanne Conners in the past had septoplasty). The patient denies any history of postnasal drainage prior to the onset of the throat discomfort but has occasionally felt like they have drainage since the throat discomfort began.  The patient also reports a sensation of pressure in the ears,. They have no history of neck surgeries, but have noticed what they believe to be swollen lymph nodes under the jaw, which may be salivary glands. The patient has been adhering to the prescribed regimen for reflux and has been implementing lifestyle modifications as recommended.       Records Reviewed:  GI notes and EGD 05/12/23  Andrea Reyes is a 52 y.o. female who presents for EGD for evaluation of esophageal eosinophilia.  Patient was most recently seen in the Gastroenterology Clinic on 03/07/23.  She has been taking her pantoprazole twice daily and has seen improvement in her chest burning. However, she still feels phlegm in throat at times, particularly at night. One of her friends did tell her that she has a hoarse voice normally. Please refer to that note for full details regarding GI history and clinical presentation.   EGD report 05/12/23 A non-obstructing Schatzki ring was found at the                            gastroesophageal junction.                           Biopsies were obtained from the proximal and distal  esophagus with cold forceps for histology of                            suspected eosinophilic esophagitis.                           A small hiatal hernia was present.                           The examined duodenum was normal. evaluation of eosinophilic esophagitis. Recommendation:           - Discharge patient to home (with escort).                           - Await pathology results.                           - Continue pantoprazole 40 mg BID for now.                           - Trial of Pepcid 20 mg QHS to see if this helps   01/14/23 52  y.o. female with a past medical history of allergies, acoustic neuroma,and others listed below presents for evaluation of dyspepsia   Gastroesophageal reflux disease, unspecified whether esophagitis present Lifestyle changes discussed, avoid NSAIDS, ETOH 2 years GERD, no alarm symptoms but patient unable to get off PPI, has some upper pharyngeal symptoms which she relates to sinuses but could be LPR Continue protonix Discussed weight loss Will schedule EGD at [facility] with Dr. Leonides Schanz for evaluation of esophagitis, EOE, gastritis.  I discussed risks of EGD with patient today, including risk of sedation, bleeding or perforation.  Patient provides understanding and gave verbal consent to proceed.   Special screening for malignant neoplasms, colon   Andrea Reyes is a 52 y.o. female who presents for EGD for evaluation of GERD .  Patient was most recently seen in the Gastroenterology Clinic on 01/14/23.  No interval change in medical history since that appointment. Please refer to that note for full details regarding GI history and clinical presentation.   Complications:            No immediate complications. Estimated Blood Loss:     Estimated blood loss was minimal. Impression:               - Non-obstructing Schatzki ring.                           - Small hiatal hernia.                           - Gastritis. Biopsied.                           - Normal examined duodenum.                           - Biopsies were taken with a cold forceps for                            histology in the  proximal esophagus and in the                            distal esophagus. Recommendation:           - Discharge patient to home (with escort).                           - Await pathology results.                           - Use Protonix (pantoprazole) 40 mg PO daily for 8                            weeks.                           - Return to GI clinic in 2-3 months or next                             available.                           - The findings and recommendations were discussed                            with the patient.    Past Medical History:  Diagnosis Date   Allergy    Anemia    Anxiety    Arthritis    Depression    GERD (gastroesophageal reflux disease)    Hyperlipidemia     Past Surgical History:  Procedure Laterality Date   COLONOSCOPY     EXCISION VAGINAL CYST     SEPTOPLASTY  09/2018   UPPER GASTROINTESTINAL ENDOSCOPY      Family History  Problem Relation Age of Onset   Allergic rhinitis Neg Hx    Asthma Neg Hx    Eczema Neg Hx    Urticaria Neg Hx    Colon cancer Neg Hx    Colon polyps Neg Hx    Esophageal cancer Neg Hx    Rectal cancer Neg Hx    Stomach cancer Neg Hx     Social History:  reports that she has never smoked. She has never used smokeless tobacco. She reports current alcohol use. She reports that she does not use drugs.  Allergies:  Allergies  Allergen Reactions   Erythromycin Rash and Other (See Comments)    Medications: I have reviewed the patient's current medications.  The PMH, PSH, Medications, Allergies, and SH were reviewed and updated.  ROS: Constitutional: Negative for fever, weight loss and weight gain. Cardiovascular: Negative for chest pain and dyspnea on exertion. Respiratory: Is not experiencing shortness of breath at rest. Gastrointestinal: Negative for nausea and vomiting. Neurological: Negative for headaches. Psychiatric: The patient is not nervous/anxious  Blood pressure 124/81, pulse 75, height 5\' 2"  (1.575 m), weight 147 lb (66.7 kg), last menstrual period 10/15/2020, SpO2 97%.  PHYSICAL EXAM:  Exam: General: Well-developed, well-nourished Respiratory Respiratory effort: Equal inspiration and expiration without stridor Cardiovascular Peripheral Vascular: Warm extremities with equal color/perfusion Eyes: No nystagmus with equal extraocular motion bilaterally Neuro/Psych/Balance: Patient oriented  to person, place, and time; Appropriate mood and  affect; Gait is intact with no imbalance; Cranial nerves I-XII are intact Head and Face Inspection: Normocephalic and atraumatic without mass or lesion Palpation: Facial skeleton intact without bony stepoffs Salivary Glands: No mass or tenderness no enlargement on exam Facial Strength: Facial motility symmetric and full bilaterally ENT Pinna: External ear intact and fully developed External canal: Canal is patent with intact skin Tympanic Membrane: Clear and mobile External Nose: No scar or anatomic deformity Internal Nose: Septum is relatively straight. No polyp, or purulence. Mucosal edema and erythema present.  Bilateral inferior turbinate hypertrophy.  Lips, Teeth, and gums: Mucosa and teeth intact and viable TMJ: No pain to palpation with full mobility Oral cavity/oropharynx: No erythema or exudate, no lesions present Nasopharynx: No mass or lesion with intact mucosa Hypopharynx: Intact mucosa without pooling of secretions Larynx Glottic: Full true vocal cord mobility without lesion or mass Supraglottic: Normal appearing epiglottis and AE folds Interarytenoid Space: Moderate pachydermia/edema Subglottic Space: Patent without lesion or edema Neck Neck and Trachea: Midline trachea without mass or lesion Thyroid: No mass or nodularity Lymphatics: No lymphadenopathy  Procedure: Preoperative diagnosis: globus sensation and hx of GERD  Postoperative diagnosis:   Same  Procedure: Flexible fiberoptic laryngoscopy  Surgeon: Ashok Croon, MD  Anesthesia: Topical lidocaine and Afrin Complications: None Condition is stable throughout exam  Indications and consent:  The patient presents to the clinic with Indirect laryngoscopy view was incomplete. Thus it was recommended that they undergo a flexible fiberoptic laryngoscopy. All of the risks, benefits, and potential complications were reviewed with the patient preoperatively and  verbal informed consent was obtained.  Procedure: The patient was seated upright in the clinic. Topical lidocaine and Afrin were applied to the nasal cavity. After adequate anesthesia had occurred, I then proceeded to pass the flexible telescope into the nasal cavity. The nasal cavity was patent without rhinorrhea or polyp. The nasopharynx was also patent without mass or lesion. The base of tongue was visualized and was normal. There were no signs of pooling of secretions in the piriform sinuses. The true vocal folds were mobile bilaterally. There were no signs of glottic or supraglottic mucosal lesion or mass. There was moderate interarytenoid pachydermia and post cricoid edema. The telescope was then slowly withdrawn and the patient tolerated the procedure throughout.   Studies Reviewed: Max face CT 01/03/23 CLINICAL DATA:  52 year old female with chronic sinusitis symptoms. Prior septoplasty in 2019.    EXAM: CT MAXILLOFACIAL WITHOUT CONTRAST   TECHNIQUE: Multidetector CT images of the paranasal sinuses were obtained using the standard protocol without intravenous contrast.   RADIATION DOSE REDUCTION: This exam was performed according to the departmental dose-optimization program which includes automated exposure control, adjustment of the mA and/or kV according to patient size and/or use of iterative reconstruction technique.   COMPARISON:  Sinus  CT 03/22/2018.   FINDINGS: Paranasal sinuses:   Frontal: Hypoplastic, clear.  Patent frontoethmoidal recesses.   Ethmoid: Stable and well aerated bilaterally.   Maxillary: Stable and well aerated bilaterally. There is mild chronic right maxillary sinus mucosal thickening, mostly in the alveolar recess (coronal image 33). No fluid level or bubbly opacity.   Sphenoid: Stable and clear.   Right ostiomeatal unit: Patent, coronal image 29.   Left ostiomeatal unit: Patent, same image.   Nasal passages: Nasal septum is intact and  midline. Nasal cavity mucosal thickening appears more symmetric than in 2019, mild throughout. No retained secretions. Olfactory recesses are clear.   Anatomy:   Anterior ethmoidal artery position redemonstrated on  series 5, image 32 with no pneumatization above either notch.   Mildly asymmetric olfactory grooves and fovea ethmoidalis, Keros II (4-21mm).   Sellar sphenoid pneumatization pattern. No clinoid process pneumatization. No Onodi cell.   Other: Visible noncontrast brain parenchyma appears stable and negative. Visualized orbits and scalp soft tissues are within normal limits. Negative visible noncontrast deep soft tissue spaces of the face.   Bilateral tympanic cavities and mastoids are clear. No acute maxillary dental finding. No acute osseous abnormality identified. As described   IMPRESSION: 1. Stable since 2019 and well aerated paranasal sinuses. Patent sinus drainage pathways. Mild chronic right maxillary sinus mucosal thickening mostly at the alveolar recess. 2. Nasal septum is intact and midline. Mild nasal cavity mucosal thickening appears increased since 2019, raising the possibility of mild rhinitis.   Path from EGD bx of esophageal mucosa 05/2023 FINAL DIAGNOSIS        1. Surgical [P], distal esophagus :       -  SQUAMOUS MUCOSA WITH MILD BASAL CELL HYPERPLASIA AND PAPILLARY ELONGATION       SUGGESTIVE OF REFLUX.        2. Surgical [P], proximal esophagus :       -  SQUAMOUS MUCOSA WITH MILD BASAL CELL HYPERPLASIA AND PAPILLARY ELONGATION.       -  ONLY A RARE INTRAEPITHELIAL EOSINOPHIL IS PRESENT (<1/HPF).   Path from EGD done in 02/10/23   Assessment/Plan: Encounter Diagnoses  Name Primary?   Globus sensation Yes   Chronic GERD    Chronic sore throat    Throat discomfort    Chronic nasal congestion    Post-nasal drip    Environmental and seasonal allergies     Assessment and Plan    Chronic Throat Irritation/discomfort globus  sensation Persistent throat irritation since July 11th, post-endoscopy. Symptoms include globus sensation, particularly on the right side. Flexible scope and overall exam is overall unremarkable without lesions or masses/tumors. She had findings c/w GERD LPR, and we discussed that her sx are likely due to chronic reflux irritation, evidenced by swelling and irritation in the posterior larynx. Chronic reflux can cause globus sensation, chronic dry cough, and throat tightness. Recommended a trial of Reflux Gourmet. Emphasized dietary management and provided [employer] and online resources. - Take Pepcid 20 mg twice daily and slowly wean off PPI  - Use Reflux Gourmet after meals and before bedtime - Provided Albertina Parr dietary management resources   Chronic Gastroesophageal Reflux Disease (GERD) GERD with symptoms previously managed by pantoprazole, recently increased to twice daily due to worsening symptoms. Current regimen includes lifestyle modifications such as elevating the head of the bed, avoiding trigger foods, and not eating close to bedtime. Small hiatal hernia noted on EGD, could be contributing to GERD. Long-term PPI use can have significant side effects; recommended transitioning to Pepcid. Intermittent fasting lacks evidence for reflux management; smaller meals with dietary adjustments are preferred. - Continue lifestyle modifications for GERD management - Gradually reduce pantoprazole dosage to wean off - Take Pepcid twice daily - Use Reflux Gourmet after meals and before bedtime  Chronic Nasal Congestion Nasal congestion and postnasal drainage managed with Flonase and Zyrtec. Examination shows nasal congestion and mild swelling, no crusting or significant drainage. Advised to continue current medications year-round - Continue Flonase 2 puffs b/l nares and Zyrtec 10 mg daily   Follow-up - Schedule follow-up appointment if symptoms do not improve after a few months. -  will consider esophagram and pH probe testing  if needed in the future       Thank you for allowing me to participate in the care of this patient. Please do not hesitate to contact me with any questions or concerns.   Ashok Croon, MD Otolaryngology [facility] Phone: [phone] Fax: [phone]    07/08/2023, 3:08 PM

## 2023-08-08 ENCOUNTER — Encounter (INDEPENDENT_AMBULATORY_CARE_PROVIDER_SITE_OTHER): Payer: Self-pay | Admitting: Otolaryngology

## 2023-08-18 DIAGNOSIS — J988 Other specified respiratory disorders: Secondary | ICD-10-CM | POA: Diagnosis not present

## 2023-08-18 DIAGNOSIS — J029 Acute pharyngitis, unspecified: Secondary | ICD-10-CM | POA: Diagnosis not present

## 2023-08-22 DIAGNOSIS — H01004 Unspecified blepharitis left upper eyelid: Secondary | ICD-10-CM | POA: Diagnosis not present

## 2023-08-22 DIAGNOSIS — H01001 Unspecified blepharitis right upper eyelid: Secondary | ICD-10-CM | POA: Diagnosis not present

## 2023-08-22 DIAGNOSIS — H04123 Dry eye syndrome of bilateral lacrimal glands: Secondary | ICD-10-CM | POA: Diagnosis not present

## 2023-08-23 ENCOUNTER — Encounter: Payer: Self-pay | Admitting: Internal Medicine

## 2023-08-23 ENCOUNTER — Ambulatory Visit: Payer: BC Managed Care – PPO | Admitting: Internal Medicine

## 2023-08-23 VITALS — BP 110/60 | HR 69 | Ht 62.0 in | Wt 136.0 lb

## 2023-08-23 DIAGNOSIS — K219 Gastro-esophageal reflux disease without esophagitis: Secondary | ICD-10-CM

## 2023-08-23 DIAGNOSIS — K5900 Constipation, unspecified: Secondary | ICD-10-CM | POA: Diagnosis not present

## 2023-08-23 DIAGNOSIS — K200 Eosinophilic esophagitis: Secondary | ICD-10-CM | POA: Diagnosis not present

## 2023-08-23 NOTE — Patient Instructions (Addendum)
Drink 8 cups of water a day and walk 30 minutes a day.  Please purchase the following medications over the counter and take as directed: Fiber supplement such as Benefiber- use as directed daily Miralax: Take as  directed up to 3 times a day to achieve regular bowel movements   If your blood pressure at your visit was 140/90 or greater, please contact your primary care physician to follow up on this.  _______________________________________________________  If you are age 27 or older, your body mass index should be between 23-30. Your Body mass index is 24.87 kg/m. If this is out of the aforementioned range listed, please consider follow up with your Primary Care Provider.  If you are age 69 or younger, your body mass index should be between 19-25. Your Body mass index is 24.87 kg/m. If this is out of the aformentioned range listed, please consider follow up with your Primary Care Provider.   ________________________________________________________  The [facility] providers would like to encourage you to use [facility] to communicate with providers for non-urgent requests or questions.  Due to long hold times on the telephone, sending your provider a message by [facility] may be a faster and more efficient way to get a response.  Please allow 48 business hours for a response.  Please remember that this is for non-urgent requests.   Thank you for entrusting me with your care and for choosing [facility],  Dr. Eulah Pont

## 2023-08-23 NOTE — Progress Notes (Signed)
08/23/2023 MAKAIAH VANAUKEN [id number] 1971/01/21  Referring provider: Aliene Beams, MD  ASSESSMENT AND PLAN:   Gastroesophageal reflux disease Esophageal eosinophilia Patient recently established with an ENT physician, who recommended H2 blockers, dietary changes, and alkaline water as a treatment for acid reflux instead of PPI therapy.  Patient does seem to be doing well on this regimen overall.  However she is still having some nighttime sore throat.  I offered to restart her on a nightly PPI, but the patient was hesitant to do so at this time.  Will have her continue to work with her ENT physician on management of her acid reflux for now.  On her prior EGD, it did suggest that her acid reflux and esophageal eosinophilia were well-controlled on PPI twice daily therapy.  For now since the patient does also have issues with constipation, we will work on that, which may help with gastric motility and potentially acid reflux as well -Continue to follow a GERD friendly diet -Continue to work with ENT physician on acid reflux  Constipation - Increase physical activity - Start Metamucil QD - Start Miralax every day - RTC 3 months  Special screening for malignant neoplasms, colon Recall 2032   Patient Care Team: Aliene Beams, MD as PCP - General (Family Medicine)  HISTORY OF PRESENT ILLNESS: 53 y.o. female with a past medical history of allergies, acoustic neuroma,and others listed below presents for evaluation of dyspepsia.   Interval History: Since her last clinic visit, she did establish with an ENT physician that recommended dietary management, famotidine, and alginate's.  She has been off of PPI and has been doing well for the most part.  She is on famotidine 40 mg BID (first dose split as 20 mg in the morning and in the afternoon). She is using the alginates after she eats. She has been drinking alkaline water. Her sore throat is gone in the daytime, though she still has some  nighttime symptoms. She has been sleeping on a reflux guard pillow. She has tried 3 different kinds of wedges and pillows. She is very constipated currently, which she attributes to being recently ill, which has been because some dietary changes and decreased her physical activity. She on average has one BM per day or every other day, but her BMs have been small pellets.   Colonoscopy 01/29/21: - The entire examined colon is normal on direct and retroflexion views. - Repeat colonoscopy recommended in 10 years  EGD 02/10/23:  Path: 1. Surgical [P], gastric bx - MILD REACTIVE GASTROPATHY. - NEGATIVE FOR H. PYLORI ON H&E STAIN - NO INTESTINAL METAPLASIA, DYSPLASIA, OR MALIGNANCY. 2. Surgical [P], distal esophagus bx - BENIGN SQUAMOUS MUCOSA WITH REFLUX CHANGES AND INCREASED INTRAEPITHELIAL EOSINOPHILS (UP TO 820 EOSINOPHILS PER HIGH-POWER FIELD). SEE NOTE. 3. Surgical [P], proximal esophagus bx - BENIGN SQUAMOUS MUCOSA WITH INCREASED INTRAEPITHELIAL EOSINOPHILS (UP TO 10 EOSINOPHILS PER HIGH-POWER FIELD). SEE NOTE. Diagnosis Note 3. - Diagnostic considerations include gastroesophageal reflux disease and eosinophilic esophagitis, with GERD being favored. Clinical and endoscopic correlation is recommended.  EGD 05/12/23: - Non- obstructing Schatzki ring. - Small hiatal hernia. - Normal examined duodenum. - Biopsies were taken with a cold forceps for evaluation of eosinophilic esophagitis. Path:      1. Surgical [P], distal esophagus :       -  SQUAMOUS MUCOSA WITH MILD BASAL CELL HYPERPLASIA AND PAPILLARY ELONGATION       SUGGESTIVE OF REFLUX.       2. Surgical [  P], proximal esophagus :       -  SQUAMOUS MUCOSA WITH MILD BASAL CELL HYPERPLASIA AND PAPILLARY ELONGATION.       -  ONLY A RARE INTRAEPITHELIAL EOSINOPHIL IS PRESENT (<1/HPF).   RELEVANT LABS AND IMAGING: CBC    Component Value Date/Time   WBC 6.1 09/08/2010 1146   RBC 4.58 09/08/2010 1146   HGB 12.4 09/08/2010 1146   HCT  38.4 09/08/2010 1146   PLT 259 09/08/2010 1146   MCV 83.8 09/08/2010 1146   MCH 27.1 09/08/2010 1146   MCHC 32.3 09/08/2010 1146   RDW 15.3 09/08/2010 1146      Current Medications:       Current Outpatient Medications (Respiratory):    cetirizine (ZYRTEC ALLERGY) 10 MG tablet, 10 mg daily as needed.   fluticasone (FLONASE) 50 MCG/ACT nasal spray, Place 1 spray into both nostrils daily.   Current Outpatient Medications (Analgesics):    butalbital-acetaminophen-caffeine (FIORICET, ESGIC) 50-325-40 MG tablet,    ibuprofen (ADVIL) 400 MG tablet, Take 400 mg by mouth as needed.   Current Outpatient Medications (Hematological):    vitamin B-12 (CYANOCOBALAMIN) 500 MCG tablet, Take 500 mcg by mouth daily.   Current Outpatient Medications (Other):    Calcium Carbonate (CALCIUM 600 PO), Take 1 tablet by mouth daily.   Calcium Carbonate Antacid (TUMS PO), Take 1,000-2,000 mg by mouth as needed.   Cholecalciferol (VITAMIN D3) 50 MCG (2000 UT) capsule, as directed Orally once a day   estradiol (ESTRACE) 0.1 MG/GM vaginal cream, Place vaginally.   famotidine (PEPCID) 20 MG tablet, Take 1 tablet (20 mg total) by mouth at bedtime.   fluorometholone (FML) 0.1 % ophthalmic suspension, Place 1 drop into both eyes as needed. (Patient not taking: Reported on 07/08/2023)   pantoprazole (PROTONIX) 40 MG tablet, Take 1 tablet (40 mg total) by mouth 2 (two) times daily.   RESTASIS 0.05 % ophthalmic emulsion, Place 1 drop into both eyes 2 (two) times daily.   sertraline (ZOLOFT) 100 MG tablet, Take 100 mg by mouth daily.   sertraline (ZOLOFT) 25 MG tablet, Take 25 mg by mouth daily.  Current Facility-Administered Medications (Other):    0.9 %  sodium chloride infusion  Medical History:  Past Medical History:  Diagnosis Date   Allergy    Anemia    Anxiety    Arthritis    Depression    GERD (gastroesophageal reflux disease)    Hyperlipidemia    Allergies:  Allergies  Allergen Reactions    Erythromycin Rash and Other (See Comments)     Surgical History:  She  has a past surgical history that includes Septoplasty (09/2018); Excision vaginal cyst; Upper gastrointestinal endoscopy; and Colonoscopy. Family History:  Her family history is not on file.  PHYSICAL EXAM: Ht 5\' 2"  (1.575 m)   Wt 136 lb (61.7 kg)   LMP 10/15/2020 Comment: irregular  BMI 24.87 kg/m  General Appearance: Well nourished, in no apparent distress. Head:   Normocephalic and atraumatic. Respiratory: Respiratory effort normal Cardio: Regular rate Abdomen: Soft,  Non-distended ,active bowel sounds. No tenderness  Neuro: Alert and  oriented x4;  No focal deficits. Psych:  Cooperative. Normal mood and affect.    Imogene Burn, MD 8:40 AM  I spent 34 minutes of time, including in depth chart review, independent review of results as outlined above, communicating results with the patient directly, face-to-face time with the patient, coordinating care, and ordering studies and medications as appropriate, and documentation.

## 2023-09-05 ENCOUNTER — Encounter: Payer: Self-pay | Admitting: Internal Medicine

## 2023-09-22 ENCOUNTER — Telehealth (INDEPENDENT_AMBULATORY_CARE_PROVIDER_SITE_OTHER): Payer: Self-pay | Admitting: Otolaryngology

## 2023-09-22 DIAGNOSIS — M25511 Pain in right shoulder: Secondary | ICD-10-CM | POA: Diagnosis not present

## 2023-09-22 DIAGNOSIS — M5030 Other cervical disc degeneration, unspecified cervical region: Secondary | ICD-10-CM | POA: Diagnosis not present

## 2023-09-22 NOTE — Telephone Encounter (Signed)
 Reminder Call: Date: 09/23/2023 Status: Sch  Time: 9:20 AM [address] [place], [place] [address]  TEXT YES-09/22/2023 8:00 AM

## 2023-09-23 ENCOUNTER — Ambulatory Visit (INDEPENDENT_AMBULATORY_CARE_PROVIDER_SITE_OTHER): Payer: BC Managed Care – PPO | Admitting: Otolaryngology

## 2023-09-23 ENCOUNTER — Ambulatory Visit (INDEPENDENT_AMBULATORY_CARE_PROVIDER_SITE_OTHER): Payer: BC Managed Care – PPO | Admitting: Audiology

## 2023-09-23 VITALS — BP 99/61 | HR 70 | Ht 62.0 in | Wt 135.0 lb

## 2023-09-23 DIAGNOSIS — R0981 Nasal congestion: Secondary | ICD-10-CM

## 2023-09-23 DIAGNOSIS — K219 Gastro-esophageal reflux disease without esophagitis: Secondary | ICD-10-CM | POA: Diagnosis not present

## 2023-09-23 DIAGNOSIS — J324 Chronic pansinusitis: Secondary | ICD-10-CM

## 2023-09-23 DIAGNOSIS — J310 Chronic rhinitis: Secondary | ICD-10-CM | POA: Diagnosis not present

## 2023-09-23 DIAGNOSIS — H903 Sensorineural hearing loss, bilateral: Secondary | ICD-10-CM | POA: Diagnosis not present

## 2023-09-23 DIAGNOSIS — D333 Benign neoplasm of cranial nerves: Secondary | ICD-10-CM | POA: Diagnosis not present

## 2023-09-23 NOTE — Progress Notes (Signed)
  [address] [place], [place] [address] [phone]  Audiological Evaluation    Name: Andrea Reyes     DOB:   23-Jun-1971      MRN:   [id number]                                                                                     Service Date: 09/23/2023     Accompanied by: unaccompanied    Patient comes today after Dr. Suszanne Conners, ENT sent a referral for a hearing evaluation as a follow up after right acoustic neuroma.   Symptoms Yes Details  Hearing loss  [x]  Bilateral symmetric hearing loss per previous audiogram completed at Dr. Avel Sensor clinic    Otoscopy: Right ear: Clear external ear canals and notable landmarks visualized on the tympanic membrane. Left ear:  Clear external ear canals and notable landmarks visualized on the tympanic membrane.  Tympanometry: Right ear: Type A- Normal external ear canal volume with normal middle ear pressure and tympanic membrane compliance Left ear: Type A- Normal external ear canal volume with normal middle ear pressure and tympanic membrane compliance   Pure tone Audiometry: Both ears-Normal hearing from [phone] Hz, then mild to moderate sensorineural hearing loss from 3000 Hz - 8000 Hz.   Speech Audiometry: Right ear- Speech Reception Threshold (SRT) was obtained at 10 dBHL. Left ear-Speech Reception Threshold (SRT) was obtained at 10 dBHL.   Word Recognition Score Tested using NU-6 (MLV) Right ear: 100% was obtained at a presentation level of 65 dBHL with contralateral masking which is deemed as  excellent. Left ear: 100% was obtained at a presentation level of 65 dBHL with contralateral masking which is deemed as  excellent.   The hearing test results were completed under headphones and results are deemed to be of good reliability. Test technique:  conventional    Impression: There is not a significant difference in pure-tone thresholds between ears and thresholds continue to be essentially stable when compared to her  previous audiogram.   Recommendations: Follow up with ENT as scheduled for today. Return for a hearing evaluation if concerns with hearing changes arise or per MD recommendation. Consider a communication needs assessment after medical clearance for hearing aids is obtained.   G[employer]ape MARIE LEROUX-MARTINEZ, AUD

## 2023-09-24 DIAGNOSIS — K219 Gastro-esophageal reflux disease without esophagitis: Secondary | ICD-10-CM | POA: Insufficient documentation

## 2023-09-24 DIAGNOSIS — H903 Sensorineural hearing loss, bilateral: Secondary | ICD-10-CM | POA: Insufficient documentation

## 2023-09-24 NOTE — Progress Notes (Signed)
 Patient ID: Andrea Reyes, female   DOB: 04/14/1971, 53 y.o.   MRN: [id number]  CC: Right ear acoustic neuroma, hearing loss, chronic nasal congestion, recurrent sinusitis  HPI: The patient is a 53 year old female who returns today for follow-up of her multiple medical issues. The patient has a history of right ear acoustic neuroma and chronic nasal obstruction.  She underwent septoplasty and turbinate reduction surgery to treat her contact point headaches in 2020. The patient's right acoustic neuroma has been stable since 2004. Her last MRI was done in 2020 at [facility] with no changes noted. The patient presents today for follow up audiogram since her scans are now only being done every 6 years.  In addition, the patient has been complaining of recurrent sinusitis and frequent crusting in her nasal cavities over the past year.  She was treated with multiple courses of antibiotics and prednisone.  Her CT scan last year was negative for any chronic sinusitis.  The patient returns today reporting that she was recently diagnosed with laryngopharyngeal reflux.  She was initially treated with pantoprazole, followed by famotidine.  Her sinus symptoms have improved with reflux medication.  Currently she denies any otalgia, otorrhea, vertigo, facial pain, fever, or visual change.  Exam: General: Communicates without difficulty, well nourished, no acute distress. Head: Normocephalic, no evidence injury, no tenderness, facial buttresses intact without stepoff. Eyes: PERRL, EOMI. No scleral icterus, conjunctivae clear. Neuro: CN II exam reveals vision grossly intact. No nystagmus at any point of gaze. Ears: Auricles well formed without lesions. Ear canals are intact without mass or lesion. No erythema or edema is appreciated. The TMs are intact without fluid. Nose: External evaluation reveals normal support and skin without lesions. Dorsum is intact. Anterior rhinoscopy reveals mildly congested mucosa over anterior aspect  of inferior turbinates and intact septum. No purulence noted. Oral:  Oral cavity and oropharynx are intact, symmetric, without erythema or edema. Mucosa is moist without lesions. Neck: Full range of motion without pain. There is no significant lymphadenopathy. No masses palpable. Thyroid bed within normal limits to palpation. Parotid glands and submandibular glands equal bilaterally without mass. Trachea is midline. Neuro:  CN 2-12 grossly intact. Gait normal. Vestibular: No nystagmus at any point of gaze. The cerebellar examination is unremarkable.   AUDIOMETRIC TESTING: I have read and reviewed the audiometric test, which shows stable bilateral high-frequency sensorineural hearing loss. The discrimination score is 100% AD and 100% AS. The tympanogram is normal bilaterally.   Assessment: 1.  Right acoustic neuroma, which has been stable since 2004. The patient is currently being followed at [facility] with MRI every 6 years. Her last MRI in 2020 was stable. 2.  Stable bilateral high-frequency sensorineural hearing loss.  3.  Chronic rhinitis with mild nasal mucosal congestion. The patient's septum and inferior turbinates are well healed from her previous surgery.  The patient has been experiencing intermittent crusting in her nasal cavities, which has improved with the use of reflux medications. 4.  Laryngopharyngeal reflux, currently on famotidine.  Plan: 1. The physical exam findings and hearing test results are reviewed with the patient.  2.  The patient is reassured that her hearing is stable bilaterally.   3.  The patient is encouraged to use nasal saline gel and perform nasal saline irrigation regularly. 4.  Continue with famotidine daily. 5.  The patient will follow up for re-evaluation in one year for audiogram to monitor her acoustic neuroma.

## 2023-09-29 DIAGNOSIS — F419 Anxiety disorder, unspecified: Secondary | ICD-10-CM | POA: Diagnosis not present

## 2023-11-14 ENCOUNTER — Encounter (INDEPENDENT_AMBULATORY_CARE_PROVIDER_SITE_OTHER): Payer: Self-pay | Admitting: Otolaryngology

## 2023-11-14 ENCOUNTER — Ambulatory Visit (INDEPENDENT_AMBULATORY_CARE_PROVIDER_SITE_OTHER): Admitting: Otolaryngology

## 2023-11-14 VITALS — BP 124/74 | HR 64

## 2023-11-14 DIAGNOSIS — R09A2 Foreign body sensation, throat: Secondary | ICD-10-CM

## 2023-11-14 DIAGNOSIS — J3089 Other allergic rhinitis: Secondary | ICD-10-CM

## 2023-11-14 DIAGNOSIS — K200 Eosinophilic esophagitis: Secondary | ICD-10-CM

## 2023-11-14 DIAGNOSIS — R0982 Postnasal drip: Secondary | ICD-10-CM

## 2023-11-14 DIAGNOSIS — K219 Gastro-esophageal reflux disease without esophagitis: Secondary | ICD-10-CM

## 2023-11-14 DIAGNOSIS — R070 Pain in throat: Secondary | ICD-10-CM

## 2023-11-14 DIAGNOSIS — J312 Chronic pharyngitis: Secondary | ICD-10-CM

## 2023-11-14 DIAGNOSIS — R0981 Nasal congestion: Secondary | ICD-10-CM

## 2023-11-14 NOTE — Progress Notes (Signed)
 ENT Progress Note:   Update 11/14/2023  Discussed the use of AI scribe software for clinical note transcription with the patient, who gave verbal consent to proceed.  History of Present Illness Andrea Reyes is a 53 year old female with GERD and EOE who presents for follow-up of throat symptoms and reflux management.  She has experienced significant improvement in her throat symptoms since her last visit. Initially, she had a constant sore throat, which resolved two weeks into following a specific regimen (Dr Drucilla Schmidt regimen). However, she continues to experience reflux symptoms, particularly at night.  She has been adhering to a strict induction diet without changes due to persistent symptoms and recently completed a 12-week master class with a dietitian specializing in reflux management. This included dietary guidance, vagus nerve exercises, and recipes. She uses a reflux guard and incorporates melatonin at night to help reduce nighttime reflux.  During a recent trip to [place], she experienced increased symptoms after eating out more than usual and not having her bed elevated, which she attributes to potential dietary triggers.  She has transitioned from PPIs to Pepcid, which she feels has controlled her GERD and EOE symptoms. She is considering weaning off it, especially since she feels her symptoms are under control.  She experiences nasal congestion, dry eyes, and ear symptoms, which she suspects may be related to reflux. She is considering stopping Flonase and Zyrtec to see if these symptoms persist without them.  Records Reviewed:  Initial Evaluation  Reason for Consult: regurgitation and GERD, throat discomfort    HPI: Discussed the use of AI scribe software for clinical note transcription with the patient, who gave verbal consent to proceed.  History of Present Illness   The patient is a 53 yoF, presents with a chief complaint of chronic throat irritation. The patient  reports a history of gastroesophageal reflux disease (GERD), for which they underwent two endoscopies. The first endoscopy, performed in July, 2024, revealed a high level of eosinophils, raising suspicion for eosinophilic esophagitis (EOE) vs inflammation due to GERD LPR. However, a second endoscopy in October, 2024 showed normal eosinophil levels. The patient reports that the throat discomfort began immediately after the first endoscopy and has persisted since.  The patient describes the heavy sensation in her throat, constantly aware of it, especially when swallowing. The discomfort is localized on the inside right side of the throat, extending down to the chest. Prior to the first endoscopy, the patient was on pantoprazole for GERD, which was initially effective. However, after a brief discontinuation before the endoscopy, the medication was no longer effective upon resumption. The dose was increased to twice daily, which has controlled the burning sensation associated with GERD, but the throat discomfort persisted.  The patient has been adhering to an anti-reflux diet, avoiding spicy, acidic foods, and alcohol. They have also implemented lifestyle modifications such as elevating the head of the bed, not eating close to bedtime, and working out with a trainer to manage weight. Despite these measures, the throat discomfort persisted.  In addition to GERD, the patient has a history of chronic nasal congestion, for which they are currently taking Zyrtec and Flonase. They report waking up with dry, crusty nasal secretions in the morning, which have improved with the use of Flonase (patient of Dr Suszanne Conners in the past had septoplasty). The patient denies any history of postnasal drainage prior to the onset of the throat discomfort but has occasionally felt like they have drainage since the throat discomfort began.  The patient also reports a sensation of pressure in the ears,. They have no history of neck  surgeries, but have noticed what they believe to be swollen lymph nodes under the jaw, which may be salivary glands. The patient has been adhering to the prescribed regimen for reflux and has been implementing lifestyle modifications as recommended.      GI notes and EGD 05/12/23  [name] is a 53 y.o. female who presents for EGD for evaluation of esophageal eosinophilia.  Patient was most recently seen in the Gastroenterology Clinic on 03/07/23.  She has been taking her pantoprazole twice daily and has seen improvement in her chest burning. However, she still feels phlegm in throat at times, particularly at night. One of her friends did tell her that she has a hoarse voice normally. Please refer to that note for full details regarding GI history and clinical presentation.   EGD report 05/12/23 A non-obstructing Schatzki ring was found at the                            gastroesophageal junction.                           Biopsies were obtained from the proximal and distal                            esophagus with cold forceps for histology of                            suspected eosinophilic esophagitis.                           A small hiatal hernia was present.                           The examined duodenum was normal. evaluation of eosinophilic esophagitis. Recommendation:           - Discharge patient to home (with escort).                           - Await pathology results.                           - Continue pantoprazole 40 mg BID for now.                           - Trial of Pepcid 20 mg QHS to see if this helps   01/14/23 53 y.o. female with a past medical history of allergies, acoustic neuroma,and others listed below presents for evaluation of dyspepsia   Gastroesophageal reflux disease, unspecified whether esophagitis present Lifestyle changes discussed, avoid NSAIDS, ETOH 2 years GERD, no alarm symptoms but patient unable to get off PPI, has some upper pharyngeal symptoms  which she relates to sinuses but could be LPR Continue protonix Discussed weight loss Will schedule EGD at [facility] with Dr. Rosaline Coma for evaluation of esophagitis, EOE, gastritis.  I discussed risks of EGD with patient today, including risk of sedation, bleeding or perforation.  Patient provides understanding and gave verbal consent to proceed.   Special screening for malignant neoplasms, colon  DELAYNE SANZO is a 53 y.o. female who presents for EGD for evaluation of GERD .  Patient was most recently seen in the Gastroenterology Clinic on 01/14/23.  No interval change in medical history since that appointment. Please refer to that note for full details regarding GI history and clinical presentation.   Complications:            No immediate complications. Estimated Blood Loss:     Estimated blood loss was minimal. Impression:               - Non-obstructing Schatzki ring.                           - Small hiatal hernia.                           - Gastritis. Biopsied.                           - Normal examined duodenum.                           - Biopsies were taken with a cold forceps for                            histology in the proximal esophagus and in the                            distal esophagus. Recommendation:           - Discharge patient to home (with escort).                           - Await pathology results.                           - Use Protonix (pantoprazole) 40 mg PO daily for 8                            weeks.                           - Return to GI clinic in 2-3 months or next                            available.                           - The findings and recommendations were discussed                            with the patient.    Past Medical History:  Diagnosis Date   Allergy    Anemia    Anxiety    Arthritis    Depression    GERD (gastroesophageal reflux disease)    Hyperlipidemia     Past Surgical History:  Procedure Laterality Date    COLONOSCOPY     EXCISION VAGINAL CYST     SEPTOPLASTY  09/2018   UPPER GASTROINTESTINAL  ENDOSCOPY      Family History  Problem Relation Age of Onset   Allergic rhinitis Neg Hx    Asthma Neg Hx    Eczema Neg Hx    Urticaria Neg Hx    Colon cancer Neg Hx    Colon polyps Neg Hx    Esophageal cancer Neg Hx    Rectal cancer Neg Hx    Stomach cancer Neg Hx     Social History:  reports that she has never smoked. She has never used smokeless tobacco. She reports current alcohol use. She reports that she does not use drugs.  Allergies:  Allergies  Allergen Reactions   Erythromycin Rash and Other (See Comments)    Medications: I have reviewed the patient's current medications.  The PMH, PSH, Medications, Allergies, and SH were reviewed and updated.  ROS: Constitutional: Negative for fever, weight loss and weight gain. Cardiovascular: Negative for chest pain and dyspnea on exertion. Respiratory: Is not experiencing shortness of breath at rest. Gastrointestinal: Negative for nausea and vomiting. Neurological: Negative for headaches. Psychiatric: The patient is not nervous/anxious  Blood pressure 124/74, pulse 64, last menstrual period 10/15/2020, SpO2 95%.  PHYSICAL EXAM:  Exam: General: Well-developed, well-nourished Respiratory Respiratory effort: Equal inspiration and expiration without stridor Cardiovascular Peripheral Vascular: Warm extremities with equal color/perfusion Eyes: No nystagmus with equal extraocular motion bilaterally Neuro/Psych/Balance: Patient oriented to person, place, and time; Appropriate mood and affect; Gait is intact with no imbalance; Cranial nerves I-XII are intact Head and Face Inspection: Normocephalic and atraumatic without mass or lesion Facial Strength: Facial motility symmetric and full bilaterally ENT Pinna: External ear intact and fully developed External Nose: No scar or anatomic deformity Neck Neck and Trachea: Midline trachea  without mass or lesion  Studies Reviewed: Max face CT 01/03/23 CLINICAL DATA:  53 year old female with chronic sinusitis symptoms. Prior septoplasty in 2019.    EXAM: CT MAXILLOFACIAL WITHOUT CONTRAST   TECHNIQUE: Multidetector CT images of the paranasal sinuses were obtained using the standard protocol without intravenous contrast.   RADIATION DOSE REDUCTION: This exam was performed according to the departmental dose-optimization program which includes automated exposure control, adjustment of the mA and/or kV according to patient size and/or use of iterative reconstruction technique.   COMPARISON:  Sinus  CT 03/22/2018.   FINDINGS: Paranasal sinuses:   Frontal: Hypoplastic, clear.  Patent frontoethmoidal recesses.   Ethmoid: Stable and well aerated bilaterally.   Maxillary: Stable and well aerated bilaterally. There is mild chronic right maxillary sinus mucosal thickening, mostly in the alveolar recess (coronal image 33). No fluid level or bubbly opacity.   Sphenoid: Stable and clear.   Right ostiomeatal unit: Patent, coronal image 29.   Left ostiomeatal unit: Patent, same image.   Nasal passages: Nasal septum is intact and midline. Nasal cavity mucosal thickening appears more symmetric than in 2019, mild throughout. No retained secretions. Olfactory recesses are clear.   Anatomy:   Anterior ethmoidal artery position redemonstrated on series 5, image 32 with no pneumatization above either notch.   Mildly asymmetric olfactory grooves and fovea ethmoidalis, Keros II (4-38mm).   Sellar sphenoid pneumatization pattern. No clinoid process pneumatization. No Onodi cell.   Other: Visible noncontrast brain parenchyma appears stable and negative. Visualized orbits and scalp soft tissues are within normal limits. Negative visible noncontrast deep soft tissue spaces of the face.   Bilateral tympanic cavities and mastoids are clear. No acute maxillary dental finding. No  acute osseous abnormality identified. As described   IMPRESSION: 1.  Stable since 2019 and well aerated paranasal sinuses. Patent sinus drainage pathways. Mild chronic right maxillary sinus mucosal thickening mostly at the alveolar recess. 2. Nasal septum is intact and midline. Mild nasal cavity mucosal thickening appears increased since 2019, raising the possibility of mild rhinitis.   Path from EGD bx of esophageal mucosa 05/2023 FINAL DIAGNOSIS        1. Surgical [P], distal esophagus :       -  SQUAMOUS MUCOSA WITH MILD BASAL CELL HYPERPLASIA AND PAPILLARY ELONGATION       SUGGESTIVE OF REFLUX.        2. Surgical [P], proximal esophagus :       -  SQUAMOUS MUCOSA WITH MILD BASAL CELL HYPERPLASIA AND PAPILLARY ELONGATION.       -  ONLY A RARE INTRAEPITHELIAL EOSINOPHIL IS PRESENT (<1/HPF).   Path from EGD done in 02/10/23   Assessment/Plan: Encounter Diagnoses  Name Primary?   Globus sensation Yes   Laryngopharyngeal reflux    Throat discomfort    Chronic sore throat    Environmental and seasonal allergies    Post-nasal drip    Chronic GERD    Chronic nasal congestion      Assessment and Plan    Chronic Throat Irritation/discomfort globus sensation Persistent throat irritation since July 11th, post-endoscopy. Symptoms include globus sensation, particularly on the right side. Flexible scope and overall exam is overall unremarkable without lesions or masses/tumors. She had findings c/w GERD LPR, and we discussed that her sx are likely due to chronic reflux irritation, evidenced by swelling and irritation in the posterior larynx. Chronic reflux can cause globus sensation, chronic dry cough, and throat tightness. Recommended a trial of Reflux Gourmet. Emphasized dietary management and provided Jamie Kaufman's website and online resources. - Take Pepcid 20 mg twice daily and slowly wean off PPI  - Use Reflux Gourmet after meals and before bedtime - Provided Chrisandra Counts  dietary management resources   Chronic Gastroesophageal Reflux Disease (GERD) GERD with symptoms previously managed by pantoprazole, recently increased to twice daily due to worsening symptoms. Current regimen includes lifestyle modifications such as elevating the head of the bed, avoiding trigger foods, and not eating close to bedtime. Small hiatal hernia noted on EGD, could be contributing to GERD. Long-term PPI use can have significant side effects; recommended transitioning to Pepcid. Intermittent fasting lacks evidence for reflux management; smaller meals with dietary adjustments are preferred. - Continue lifestyle modifications for GERD management - Gradually reduce pantoprazole dosage to wean off - Take Pepcid twice daily - Use Reflux Gourmet after meals and before bedtime  Chronic Nasal Congestion Nasal congestion and postnasal drainage managed with Flonase and Zyrtec. Examination shows nasal congestion and mild swelling, no crusting or significant drainage. Advised to continue current medications year-round - Continue Flonase 2 puffs b/l nares and Zyrtec 10 mg daily   Follow-up - Schedule follow-up appointment if symptoms do not improve after a few months. - will consider esophagram and pH probe testing if needed in the future      Update 11/14/2023 Assessment and Plan Assessment & Plan Gastroesophageal Reflux Disease (GERD) LPR Significant improvement with dietary changes and current management. Off PPIs, on Pepcid, considering weaning. Concern about long-term acid blocker use. Consultation with GI doctor crucial due to history of erosive esophagitis and EoE. - Discuss with GI doctor about the possibility of weaning off Pepcid. - Continue dietary interventions and use of reflux guard and melatonin.  Eosinophilic Esophagitis (EoE) Uncertain diagnosis. PPIs  may reduce eosinophil levels. Defer management to GI specialist. - Consult with GI doctor regarding management of EoE and need  for repeat endoscopy or biopsy.  Chronic Nasal Congestion Persistent congestion possibly related to reflux. Considering stopping Flonase and Zyrtec to assess impact. - Consider trial of stopping Flonase and Zyrtec to assess changes in nasal congestion symptoms.  Follow-up Plans to discuss GERD and EoE management with GI doctor, including potential Pepcid weaning and further testing. - Follow up with GI doctor to discuss GERD and EoE management, including potential weaning off Pepcid and need for pH impedance testing.     Artice Last, MD Otolaryngology [facility] Phone: [phone] Fax: [phone]    11/14/2023, 2:51 PM

## 2023-11-17 ENCOUNTER — Encounter (INDEPENDENT_AMBULATORY_CARE_PROVIDER_SITE_OTHER): Payer: Self-pay | Admitting: Otolaryngology

## 2023-11-22 ENCOUNTER — Ambulatory Visit (INDEPENDENT_AMBULATORY_CARE_PROVIDER_SITE_OTHER): Payer: BC Managed Care – PPO | Admitting: Internal Medicine

## 2023-11-22 ENCOUNTER — Encounter: Payer: Self-pay | Admitting: Internal Medicine

## 2023-11-22 VITALS — BP 116/68 | HR 76 | Ht 61.0 in | Wt 126.0 lb

## 2023-11-22 DIAGNOSIS — Z1211 Encounter for screening for malignant neoplasm of colon: Secondary | ICD-10-CM | POA: Diagnosis not present

## 2023-11-22 DIAGNOSIS — K5900 Constipation, unspecified: Secondary | ICD-10-CM | POA: Diagnosis not present

## 2023-11-22 DIAGNOSIS — K219 Gastro-esophageal reflux disease without esophagitis: Secondary | ICD-10-CM | POA: Diagnosis not present

## 2023-11-22 DIAGNOSIS — K200 Eosinophilic esophagitis: Secondary | ICD-10-CM | POA: Diagnosis not present

## 2023-11-22 NOTE — Patient Instructions (Signed)
 You have been scheduled for an endoscopy. Please follow written instructions given to you at your visit today.  If you use inhalers (even only as needed), please bring them with you on the day of your procedure.  If you take any of the following medications, they will need to be adjusted prior to your procedure:   DO NOT TAKE 7 DAYS PRIOR TO TEST- Trulicity (dulaglutide) Ozempic, Wegovy (semaglutide) Mounjaro (tirzepatide) Bydureon Bcise (exanatide extended release)  DO NOT TAKE 1 DAY PRIOR TO YOUR TEST Rybelsus (semaglutide) Adlyxin (lixisenatide) Victoza (liraglutide) Byetta (exanatide) ___________________________________________________________________________   _______________________________________________________  If your blood pressure at your visit was 140/90 or greater, please contact your primary care physician to follow up on this.  _______________________________________________________  If you are age 54 or older, your body mass index should be between 23-30. Your Body mass index is 23.81 kg/m. If this is out of the aforementioned range listed, please consider follow up with your Primary Care Provider.  If you are age 54 or younger, your body mass index should be between 19-25. Your Body mass index is 23.81 kg/m. If this is out of the aformentioned range listed, please consider follow up with your Primary Care Provider.   ________________________________________________________  The [facility] providers would like to encourage you to use MYCHART to communicate with providers for non-urgent requests or questions.  Due to long hold times on the telephone, sending your provider a message by [facility] may be a faster and more efficient way to get a response.  Please allow 48 business hours for a response.  Please remember that this is for non-urgent requests.  _______________________________________________________  Due to recent changes in healthcare laws, you may see  the results of your imaging and laboratory studies on MyChart before your provider has had a chance to review them.  We understand that in some cases there may be results that are confusing or concerning to you. Not all laboratory results come back in the same time frame and the provider may be waiting for multiple results in order to interpret others.  Please give us  48 hours in order for your provider to thoroughly review all the results before contacting the office for clarification of your results.   Thank you for entrusting me with your care and for choosing [facility],  Dr. Regino Caprio

## 2023-11-22 NOTE — Progress Notes (Signed)
 11/22/2023 Andrea Reyes [id number] 18-Nov-1970  Referring provider: Dorena Gander, MD  ASSESSMENT AND PLAN:   Gastroesophageal reflux disease Esophageal eosinophilia Patient has had immense benefit from adjusting her diet and continuing to use alkaline water, alginates, and H2 blocker. She is no longer having any symptoms of LPR (not since 07/2023). Will continue this regimen and then perform an EGD to try to determine for certain if her esophageal eosinophilia seen previously was due to acid reflux or eosinophilic esophagitis. Theoretically her H2 blocker should not aid in controlling EoE so we should be able to determine for certain if the patient had elevated esophageal eosinophilia due to acid reflux or true EoE. -Continue to follow a GERD friendly diet -Continue H2 blocker and Reflux Gourmet -EGD LEC to definitively evaluate for EoE  Constipation Appears to be improved on Sunfiber and magnesium citrate. Still feels somewhat bloated when she is more constipated, though this is not excessively bothersome. No abdominal pain.  - Continue daily fiber supplement - Continue magnesium citrate - Continue pelvic floor exercises  Special screening for malignant neoplasms, colon Recall 2032   Patient Care Team: Dorena Gander, MD as PCP - General (Family Medicine)  HISTORY OF PRESENT ILLNESS: 53 y.o. female with a past medical history of allergies, acoustic neuroma, EDS, constipation, and GERD presents for follow up of GERD  Interval History: Her constipation and acid reflux have improved. She went to a 12 week course with Cecelia Coddington who is an expert in acid reflux nutrition. For constipation, she was added Sun fiber and magnesium citrate. She has been doing some pelvic floor exercises. On this regimen she is having one BM once every 2 days. She has been cutting back on alcohol and caffeine, though she does still drink some coffee on occasion. Denies ab pain. She does feel  bloated, particularly if she does not have a BM. Her reflux has gotten better. Her chest discomfort had gotten better after she took the PPI BID, but afterwards she felt LPR with sore throat. Since 07/2023, her LPR has now gone away with a combination of dietary changes, famotidine , alkaline water, and alginates. She thinks that her weight loss has also helped with her acid reflux symptoms.  Wt Readings from Last 3 Encounters:  11/22/23 126 lb (57.2 kg)  09/23/23 135 lb (61.2 kg)  08/23/23 136 lb (61.7 kg)   Colonoscopy 01/29/21: - The entire examined colon is normal on direct and retroflexion views. - Repeat colonoscopy recommended in 10 years  EGD 02/10/23:  Path: 1. Surgical [P], gastric bx - MILD REACTIVE GASTROPATHY. - NEGATIVE FOR H. PYLORI ON H&E STAIN - NO INTESTINAL METAPLASIA, DYSPLASIA, OR MALIGNANCY. 2. Surgical [P], distal esophagus bx - BENIGN SQUAMOUS MUCOSA WITH REFLUX CHANGES AND INCREASED INTRAEPITHELIAL EOSINOPHILS (UP TO 820 EOSINOPHILS PER HIGH-POWER FIELD). SEE NOTE. 3. Surgical [P], proximal esophagus bx - BENIGN SQUAMOUS MUCOSA WITH INCREASED INTRAEPITHELIAL EOSINOPHILS (UP TO 10 EOSINOPHILS PER HIGH-POWER FIELD). SEE NOTE. Diagnosis Note 3. - Diagnostic considerations include gastroesophageal reflux disease and eosinophilic esophagitis, with GERD being favored. Clinical and endoscopic correlation is recommended.  EGD 05/12/23: - Non- obstructing Schatzki ring. - Small hiatal hernia. - Normal examined duodenum. - Biopsies were taken with a cold forceps for evaluation of eosinophilic esophagitis. Path:      1. Surgical [P], distal esophagus :       -  SQUAMOUS MUCOSA WITH MILD BASAL CELL HYPERPLASIA AND PAPILLARY ELONGATION SUGGESTIVE OF REFLUX.  2. Surgical [P], proximal esophagus :       -  SQUAMOUS MUCOSA WITH MILD BASAL CELL HYPERPLASIA AND PAPILLARY ELONGATION.       -  ONLY A RARE INTRAEPITHELIAL EOSINOPHIL IS PRESENT (<1/HPF).   RELEVANT LABS AND  IMAGING: CBC    Component Value Date/Time   WBC 6.1 09/08/2010 1146   RBC 4.58 09/08/2010 1146   HGB 12.4 09/08/2010 1146   HCT 38.4 09/08/2010 1146   PLT 259 09/08/2010 1146   MCV 83.8 09/08/2010 1146   MCH 27.1 09/08/2010 1146   MCHC 32.3 09/08/2010 1146   RDW 15.3 09/08/2010 1146      Current Medications:       Current Outpatient Medications (Respiratory):    cetirizine (ZYRTEC ALLERGY) 10 MG tablet, 10 mg daily as needed.   fluticasone  (FLONASE ) 50 MCG/ACT nasal spray, Place 1 spray into both nostrils daily.   Current Outpatient Medications (Analgesics):    butalbital-acetaminophen -caffeine (FIORICET, ESGIC) 50-325-40 MG tablet,    ibuprofen (ADVIL) 400 MG tablet, Take 400 mg by mouth as needed.   Current Outpatient Medications (Hematological):    vitamin B-12 (CYANOCOBALAMIN ) 500 MCG tablet, Take 500 mcg by mouth daily.   Current Outpatient Medications (Other):    Cholecalciferol (VITAMIN D3) 50 MCG (2000 UT) capsule, as directed Orally once a day   estradiol (ESTRACE) 0.1 MG/GM vaginal cream, Place vaginally.   famotidine  (PEPCID ) 20 MG tablet, 20 mg 2 (two) times daily.   OVER THE COUNTER MEDICATION, Pt is using reflux guard   RESTASIS 0.05 % ophthalmic emulsion, Place 1 drop into both eyes 2 (two) times daily.   sertraline (ZOLOFT) 100 MG tablet, Take 100 mg by mouth daily.   sertraline (ZOLOFT) 25 MG tablet, Take 25 mg by mouth daily.  Current Facility-Administered Medications (Other):    0.9 %  sodium chloride  infusion  Medical History:  Past Medical History:  Diagnosis Date   Allergy    Anemia    Anxiety    Arthritis    Depression    GERD (gastroesophageal reflux disease)    Hyperlipidemia    Allergies:  Allergies  Allergen Reactions   Erythromycin Rash and Other (See Comments)     Surgical History:  She  has a past surgical history that includes Septoplasty (09/2018); Excision vaginal cyst; Upper gastrointestinal endoscopy; and  Colonoscopy. Family History:  Her family history is not on file.  PHYSICAL EXAM: BP 116/68   Pulse 76   Ht 5\' 1"  (1.549 m)   Wt 126 lb (57.2 kg)   LMP 10/15/2020 Comment: irregular  BMI 23.81 kg/m  General Appearance: Well nourished, in no apparent distress. Head:   Normocephalic and atraumatic. Respiratory: Respiratory effort normal Cardio: Regular rate Abdomen: Soft,  Non-distended ,active bowel sounds. No tenderness  Neuro: Alert and  oriented x4;  No focal deficits. Psych:  Cooperative. Normal mood and affect.   Daina Drum, MD 10:52 AM  I spent 41 minutes of time, including in depth chart review, independent review of results as outlined above, communicating results with the patient directly, face-to-face time with the patient, coordinating care, and ordering studies and medications as appropriate, and documentation.

## 2023-11-23 ENCOUNTER — Ambulatory Visit: Admitting: Internal Medicine

## 2023-11-23 ENCOUNTER — Encounter: Payer: Self-pay | Admitting: Internal Medicine

## 2023-11-23 VITALS — BP 114/48 | HR 67 | Temp 97.9°F | Resp 12 | Ht 61.0 in | Wt 126.0 lb

## 2023-11-23 DIAGNOSIS — K449 Diaphragmatic hernia without obstruction or gangrene: Secondary | ICD-10-CM

## 2023-11-23 DIAGNOSIS — K219 Gastro-esophageal reflux disease without esophagitis: Secondary | ICD-10-CM | POA: Diagnosis not present

## 2023-11-23 DIAGNOSIS — K200 Eosinophilic esophagitis: Secondary | ICD-10-CM

## 2023-11-23 DIAGNOSIS — K2289 Other specified disease of esophagus: Secondary | ICD-10-CM | POA: Diagnosis not present

## 2023-11-23 DIAGNOSIS — K222 Esophageal obstruction: Secondary | ICD-10-CM

## 2023-11-23 MED ORDER — SODIUM CHLORIDE 0.9 % IV SOLN: 500.0000 mL | Freq: Once | INTRAVENOUS | Status: DC

## 2023-11-23 NOTE — Progress Notes (Signed)
 Called to room to assist during endoscopic procedure.  Patient ID and intended procedure confirmed with present staff. Received instructions for my participation in the procedure from the performing physician.

## 2023-11-23 NOTE — Patient Instructions (Addendum)
 Continue with regular diet Continue with regular medications Await pathology results   YOU HAD AN ENDOSCOPIC PROCEDURE TODAY AT THE [facility]:   Refer to the procedure report that was given to you for any specific questions about what was found during the examination.  If the procedure report does not answer your questions, please call your gastroenterologist to clarify.  If you requested that your care partner not be given the details of your procedure findings, then the procedure report has been included in a sealed envelope for you to review at your convenience later.  YOU SHOULD EXPECT: Some feelings of bloating in the abdomen. Passage of more gas than usual.  Walking can help get rid of the air that was put into your GI tract during the procedure and reduce the bloating. If you had a lower endoscopy (such as a colonoscopy or flexible sigmoidoscopy) you may notice spotting of blood in your stool or on the toilet paper. If you underwent a bowel prep for your procedure, you may not have a normal bowel movement for a few days.  Please Note:  You might notice some irritation and congestion in your nose or some drainage.  This is from the oxygen used during your procedure.  There is no need for concern and it should clear up in a day or so.  SYMPTOMS TO REPORT IMMEDIATELY:  Following upper endoscopy (EGD)  Vomiting of blood or coffee ground material  New chest pain or pain under the shoulder blades  Painful or persistently difficult swallowing  New shortness of breath  Fever of 100F or higher  Black, tarry-looking stools  For urgent or emergent issues, a gastroenterologist can be reached at any hour by calling [phone]. Do not use MyChart messaging for urgent concerns.    DIET:  We do recommend a small meal at first, but then you may proceed to your regular diet.  Drink plenty of fluids but you should avoid alcoholic beverages for 24 hours.  ACTIVITY:  You should plan  to take it easy for the rest of today and you should NOT DRIVE or use heavy machinery until tomorrow (because of the sedation medicines used during the test).    FOLLOW UP: Our staff will call the number listed on your records the next business day following your procedure.  We will call around 7:15- 8:00 am to check on you and address any questions or concerns that you may have regarding the information given to you following your procedure. If we do not reach you, we will leave a message.     If any biopsies were taken you will be contacted by phone or by letter within the next 1-3 weeks.  Please call us  at [phone] if you have not heard about the biopsies in 3 weeks.    SIGNATURES/CONFIDENTIALITY: You and/or your care partner have signed paperwork which will be entered into your electronic medical record.  These signatures attest to the fact that that the information above on your After Visit Summary has been reviewed and is understood.  Full responsibility of the confidentiality of this discharge information lies with you and/or your care-partner.

## 2023-11-23 NOTE — Progress Notes (Signed)
 Pt's states no medical or surgical changes since previsit or office visit.

## 2023-11-23 NOTE — Progress Notes (Signed)
 GASTROENTEROLOGY PROCEDURE H&P NOTE   Primary Care Physician: Dorena Gander, MD    Reason for Procedure:   GERD, esophageal eosinophilia  Plan:    EGD  Patient is appropriate for endoscopic procedure(s) in the ambulatory ([facility]) setting.  The nature of the procedure, as well as the risks, benefits, and alternatives were carefully and thoroughly reviewed with the patient. Ample time for discussion and questions allowed. The patient understood, was satisfied, and agreed to proceed.     HPI: Andrea Reyes is a 53 y.o. female who presents for EGD for GERD and esophageal eosinophilia.  Past Medical History:  Diagnosis Date   Allergy    Anemia    Anxiety    Arthritis    Basal cell carcinoma    Depression    Ehlers-Danlos, hypermobile type    Eosinophilic esophagitis    GERD (gastroesophageal reflux disease)    Hyperlipidemia    Laryngopharyngeal reflux (LPR)    Vestibular schwannoma [facility])     Past Surgical History:  Procedure Laterality Date   BUNIONECTOMY Left    on top of left big toe   COLONOSCOPY     EXCISION VAGINAL CYST     SEPTOPLASTY  09/2018   UPPER GASTROINTESTINAL ENDOSCOPY      Prior to Admission medications   Medication Sig Start Date End Date Taking? Authorizing Provider  Calcium Polycarbophil (FIBER-CAPS PO) Take by mouth. "Sun fiber"   Yes [provider]  cetirizine (ZYRTEC ALLERGY) 10 MG tablet 10 mg daily as needed.   Yes [provider]  Cholecalciferol (VITAMIN D3) 50 MCG (2000 UT) capsule as directed Orally once a day   Yes [provider]  famotidine  (PEPCID ) 20 MG tablet 20 mg 2 (two) times daily.   Yes [provider]  fluticasone  (FLONASE ) 50 MCG/ACT nasal spray Place 1 spray into both nostrils daily.   Yes [provider]  ibuprofen (ADVIL) 400 MG tablet Take 400 mg by mouth as needed.   Yes [provider]  magnesium oxide (MAG-OX) 400 (240 Mg) MG tablet Take 400 mg by mouth daily.    Yes [provider]  OVER THE COUNTER MEDICATION Pt is using reflux guard   Yes [provider]  RESTASIS 0.05 % ophthalmic emulsion Place 1 drop into both eyes 2 (two) times daily. 04/20/23  Yes [provider]  sertraline (ZOLOFT) 100 MG tablet Take 100 mg by mouth daily.   Yes [provider]  sertraline (ZOLOFT) 25 MG tablet Take 25 mg by mouth daily. 12/31/20  Yes [provider]  vitamin B-12 (CYANOCOBALAMIN ) 500 MCG tablet Take 500 mcg by mouth daily.   Yes [provider]  butalbital-acetaminophen -caffeine (FIORICET, ESGIC) 50-325-40 MG tablet  03/21/18   [provider]  estradiol (ESTRACE) 0.1 MG/GM vaginal cream Place vaginally. 06/06/23   [provider]    Current Outpatient Medications  Medication Sig Dispense Refill   Calcium Polycarbophil (FIBER-CAPS PO) Take by mouth. "Sun fiber"     cetirizine (ZYRTEC ALLERGY) 10 MG tablet 10 mg daily as needed.     Cholecalciferol (VITAMIN D3) 50 MCG (2000 UT) capsule as directed Orally once a day     famotidine  (PEPCID ) 20 MG tablet 20 mg 2 (two) times daily.     fluticasone  (FLONASE ) 50 MCG/ACT nasal spray Place 1 spray into both nostrils daily.     ibuprofen (ADVIL) 400 MG tablet Take 400 mg by mouth as needed.     magnesium oxide (MAG-OX)  400 (240 Mg) MG tablet Take 400 mg by mouth daily.     OVER THE COUNTER MEDICATION Pt is using reflux guard     RESTASIS 0.05 % ophthalmic emulsion Place 1 drop into both eyes 2 (two) times daily.     sertraline (ZOLOFT) 100 MG tablet Take 100 mg by mouth daily.     sertraline (ZOLOFT) 25 MG tablet Take 25 mg by mouth daily.     vitamin B-12 (CYANOCOBALAMIN ) 500 MCG tablet Take 500 mcg by mouth daily.     butalbital-acetaminophen -caffeine (FIORICET, ESGIC) 50-325-40 MG tablet   0   estradiol (ESTRACE) 0.1 MG/GM vaginal cream Place vaginally.     Current Facility-Administered Medications  Medication Dose Route Frequency Provider Last  Rate Last Admin   0.9 %  sodium chloride  infusion  500 mL Intravenous Once Daina Drum, MD        Allergies as of 11/23/2023 - Review Complete 11/23/2023  Allergen Reaction Noted   Erythromycin Rash and Other (See Comments) 03/28/2012    Family History  Problem Relation Age of Onset   Allergic rhinitis Neg Hx    Asthma Neg Hx    Eczema Neg Hx    Urticaria Neg Hx    Colon cancer Neg Hx    Colon polyps Neg Hx    Esophageal cancer Neg Hx    Rectal cancer Neg Hx    Stomach cancer Neg Hx     Social History   Socioeconomic History   Marital status: Married    Spouse name: Not on file   Number of children: Not on file   Years of education: Not on file   Highest education level: Not on file  Occupational History   Not on file  Tobacco Use   Smoking status: Never   Smokeless tobacco: Never  Vaping Use   Vaping status: Never Used  Substance and Sexual Activity   Alcohol use: Not Currently   Drug use: Never   Sexual activity: Not on file  Other Topics Concern   Not on file  Social History Narrative   Not on file   Social Drivers of Health   Financial Resource Strain: Not on file  Food Insecurity: Not on file  Transportation Needs: Not on file  Physical Activity: Not on file  Stress: Not on file  Social Connections: Not on file  Intimate Partner Violence: Not on file    Physical Exam: Vital signs in last 24 hours: BP (!) 117/53   Pulse (!) 54   Temp 97.9 F (36.6 C) (Temporal)   Ht 5\' 1"  (1.549 m)   Wt 126 lb (57.2 kg)   LMP 10/15/2020 Comment: irregular  SpO2 99%   BMI 23.81 kg/m  GEN: NAD EYE: Sclerae anicteric ENT: MMM CV: Non-tachycardic Pulm: No increased work of breathing GI: Soft, NT/ND NEURO:  Alert & Oriented   Regino Caprio, MD [facility]  11/23/2023 10:05 AM

## 2023-11-23 NOTE — Progress Notes (Signed)
 A/o x 3, VSS, gd SR's, pleased with anesthesia, report to RN

## 2023-11-23 NOTE — Op Note (Signed)
 [facility] Patient Name: Andrea Reyes Procedure Date: 11/23/2023 10:14 AM MRN: [id number] Endoscopist: Pedro Bourgeois , , [id number] Age: 53 Referring MD:  Date of Birth: 05/14/1971 Gender: Female Account #: [id number] Procedure:                Upper GI endoscopy Indications:              Heartburn, Follow-up of eosinophilic esophagitis Medicines:                Monitored Anesthesia Care Procedure:                Pre-Anesthesia Assessment:                           - Prior to the procedure, a History and Physical                            was performed, and patient medications and                            allergies were reviewed. The patient's tolerance of                            previous anesthesia was also reviewed. The risks                            and benefits of the procedure and the sedation                            options and risks were discussed with the patient.                            All questions were answered, and informed consent                            was obtained. Prior Anticoagulants: The patient has                            taken no anticoagulant or antiplatelet agents. ASA                            Grade Assessment: II - A patient with mild systemic                            disease. After reviewing the risks and benefits,                            the patient was deemed in satisfactory condition to                            undergo the procedure.                           After obtaining informed consent, the endoscope was  passed under direct vision. Throughout the                            procedure, the patient's blood pressure, pulse, and                            oxygen saturations were monitored continuously. The                            Olympus Scope SN [id number] was introduced through the                            mouth, and advanced to the second part of duodenum.                             The upper GI endoscopy was accomplished without                            difficulty. The patient tolerated the procedure                            well. Scope In: Scope Out: Findings:                 Biopsies were obtained from the proximal and distal                            esophagus with cold forceps for histology of                            esophageal eosinophilia.                           A non-obstructing Schatzki ring was found at the                            gastroesophageal junction.                           A small sliding hiatal hernia was present.                           The gastroesophageal flap valve was visualized                            endoscopically and classified as Hill Grade II                            (fold present, opens with respiration).                           The examined duodenum was normal. Complications:            No immediate complications. Estimated Blood Loss:     Estimated blood loss was minimal. Impression:               -  Non-obstructing Schatzki ring.                           - Small hiatal hernia.                           - Gastroesophageal flap valve classified as Hill                            Grade II (fold present, opens with respiration).                           - Normal examined duodenum.                           - Biopsies were taken with a cold forceps for                            evaluation of eosinophilic esophagitise. Recommendation:           - Discharge patient to home (with escort).                           - Await pathology results.                           - The findings and recommendations were discussed                            with the patient. Dr Pedro Bourgeois "[place]" [place],  11/23/2023 10:38:44 AM

## 2023-11-24 ENCOUNTER — Telehealth: Payer: Self-pay

## 2023-11-24 NOTE — Telephone Encounter (Signed)
  Follow up Call-     11/23/2023    9:21 AM 05/12/2023    7:28 AM 02/10/2023    9:39 AM  Call back number  Post procedure Call Back phone  # [phone]  Permission to leave phone message Yes Yes Yes     Patient questions:  Do you have a fever, pain , or abdominal swelling? No. Pain Score  0 *  Have you tolerated food without any problems? Yes.    Have you been able to return to your normal activities? Yes.    Do you have any questions about your discharge instructions: Diet   No. Medications  No. Follow up visit  No.  Do you have questions or concerns about your Care? No.  Actions: * If pain score is 4 or above: No action needed, pain <4.

## 2023-11-25 ENCOUNTER — Encounter: Payer: Self-pay | Admitting: Internal Medicine

## 2023-11-25 LAB — SURGICAL PATHOLOGY

## 2023-12-07 DIAGNOSIS — M9902 Segmental and somatic dysfunction of thoracic region: Secondary | ICD-10-CM | POA: Diagnosis not present

## 2023-12-07 DIAGNOSIS — M9903 Segmental and somatic dysfunction of lumbar region: Secondary | ICD-10-CM | POA: Diagnosis not present

## 2023-12-07 DIAGNOSIS — M7041 Prepatellar bursitis, right knee: Secondary | ICD-10-CM | POA: Diagnosis not present

## 2023-12-07 DIAGNOSIS — M9901 Segmental and somatic dysfunction of cervical region: Secondary | ICD-10-CM | POA: Diagnosis not present

## 2023-12-07 DIAGNOSIS — M7042 Prepatellar bursitis, left knee: Secondary | ICD-10-CM | POA: Diagnosis not present

## 2023-12-07 DIAGNOSIS — R0782 Intercostal pain: Secondary | ICD-10-CM | POA: Diagnosis not present

## 2023-12-13 DIAGNOSIS — R0782 Intercostal pain: Secondary | ICD-10-CM | POA: Diagnosis not present

## 2023-12-13 DIAGNOSIS — M7042 Prepatellar bursitis, left knee: Secondary | ICD-10-CM | POA: Diagnosis not present

## 2023-12-13 DIAGNOSIS — M9902 Segmental and somatic dysfunction of thoracic region: Secondary | ICD-10-CM | POA: Diagnosis not present

## 2023-12-13 DIAGNOSIS — M7041 Prepatellar bursitis, right knee: Secondary | ICD-10-CM | POA: Diagnosis not present

## 2023-12-13 DIAGNOSIS — M9903 Segmental and somatic dysfunction of lumbar region: Secondary | ICD-10-CM | POA: Diagnosis not present

## 2023-12-13 DIAGNOSIS — M9901 Segmental and somatic dysfunction of cervical region: Secondary | ICD-10-CM | POA: Diagnosis not present

## 2023-12-14 DIAGNOSIS — F4322 Adjustment disorder with anxiety: Secondary | ICD-10-CM | POA: Diagnosis not present

## 2023-12-20 DIAGNOSIS — F4322 Adjustment disorder with anxiety: Secondary | ICD-10-CM | POA: Diagnosis not present

## 2023-12-22 DIAGNOSIS — M7041 Prepatellar bursitis, right knee: Secondary | ICD-10-CM | POA: Diagnosis not present

## 2023-12-22 DIAGNOSIS — M9903 Segmental and somatic dysfunction of lumbar region: Secondary | ICD-10-CM | POA: Diagnosis not present

## 2023-12-22 DIAGNOSIS — R0782 Intercostal pain: Secondary | ICD-10-CM | POA: Diagnosis not present

## 2023-12-22 DIAGNOSIS — M9902 Segmental and somatic dysfunction of thoracic region: Secondary | ICD-10-CM | POA: Diagnosis not present

## 2023-12-22 DIAGNOSIS — M7042 Prepatellar bursitis, left knee: Secondary | ICD-10-CM | POA: Diagnosis not present

## 2023-12-22 DIAGNOSIS — M9901 Segmental and somatic dysfunction of cervical region: Secondary | ICD-10-CM | POA: Diagnosis not present

## 2023-12-30 DIAGNOSIS — M9902 Segmental and somatic dysfunction of thoracic region: Secondary | ICD-10-CM | POA: Diagnosis not present

## 2023-12-30 DIAGNOSIS — M7042 Prepatellar bursitis, left knee: Secondary | ICD-10-CM | POA: Diagnosis not present

## 2023-12-30 DIAGNOSIS — M7041 Prepatellar bursitis, right knee: Secondary | ICD-10-CM | POA: Diagnosis not present

## 2023-12-30 DIAGNOSIS — M9903 Segmental and somatic dysfunction of lumbar region: Secondary | ICD-10-CM | POA: Diagnosis not present

## 2023-12-30 DIAGNOSIS — R0782 Intercostal pain: Secondary | ICD-10-CM | POA: Diagnosis not present

## 2023-12-30 DIAGNOSIS — M9901 Segmental and somatic dysfunction of cervical region: Secondary | ICD-10-CM | POA: Diagnosis not present

## 2024-01-04 DIAGNOSIS — F4322 Adjustment disorder with anxiety: Secondary | ICD-10-CM | POA: Diagnosis not present

## 2024-01-05 DIAGNOSIS — R0782 Intercostal pain: Secondary | ICD-10-CM | POA: Diagnosis not present

## 2024-01-05 DIAGNOSIS — M7041 Prepatellar bursitis, right knee: Secondary | ICD-10-CM | POA: Diagnosis not present

## 2024-01-05 DIAGNOSIS — M9902 Segmental and somatic dysfunction of thoracic region: Secondary | ICD-10-CM | POA: Diagnosis not present

## 2024-01-05 DIAGNOSIS — M7042 Prepatellar bursitis, left knee: Secondary | ICD-10-CM | POA: Diagnosis not present

## 2024-01-05 DIAGNOSIS — M9903 Segmental and somatic dysfunction of lumbar region: Secondary | ICD-10-CM | POA: Diagnosis not present

## 2024-01-05 DIAGNOSIS — M9901 Segmental and somatic dysfunction of cervical region: Secondary | ICD-10-CM | POA: Diagnosis not present

## 2024-01-13 DIAGNOSIS — R0782 Intercostal pain: Secondary | ICD-10-CM | POA: Diagnosis not present

## 2024-01-13 DIAGNOSIS — M9902 Segmental and somatic dysfunction of thoracic region: Secondary | ICD-10-CM | POA: Diagnosis not present

## 2024-01-13 DIAGNOSIS — M7042 Prepatellar bursitis, left knee: Secondary | ICD-10-CM | POA: Diagnosis not present

## 2024-01-13 DIAGNOSIS — M9901 Segmental and somatic dysfunction of cervical region: Secondary | ICD-10-CM | POA: Diagnosis not present

## 2024-01-13 DIAGNOSIS — M9903 Segmental and somatic dysfunction of lumbar region: Secondary | ICD-10-CM | POA: Diagnosis not present

## 2024-01-13 DIAGNOSIS — M7041 Prepatellar bursitis, right knee: Secondary | ICD-10-CM | POA: Diagnosis not present

## 2024-01-17 DIAGNOSIS — F4322 Adjustment disorder with anxiety: Secondary | ICD-10-CM | POA: Diagnosis not present

## 2024-01-25 DIAGNOSIS — F4322 Adjustment disorder with anxiety: Secondary | ICD-10-CM | POA: Diagnosis not present

## 2024-02-06 DIAGNOSIS — G250 Essential tremor: Secondary | ICD-10-CM | POA: Diagnosis not present

## 2024-02-06 DIAGNOSIS — D333 Benign neoplasm of cranial nerves: Secondary | ICD-10-CM | POA: Diagnosis not present

## 2024-02-07 DIAGNOSIS — R293 Abnormal posture: Secondary | ICD-10-CM | POA: Diagnosis not present

## 2024-02-07 DIAGNOSIS — M25521 Pain in right elbow: Secondary | ICD-10-CM | POA: Diagnosis not present

## 2024-02-08 DIAGNOSIS — R0782 Intercostal pain: Secondary | ICD-10-CM | POA: Diagnosis not present

## 2024-02-08 DIAGNOSIS — M7041 Prepatellar bursitis, right knee: Secondary | ICD-10-CM | POA: Diagnosis not present

## 2024-02-08 DIAGNOSIS — M9903 Segmental and somatic dysfunction of lumbar region: Secondary | ICD-10-CM | POA: Diagnosis not present

## 2024-02-08 DIAGNOSIS — M7042 Prepatellar bursitis, left knee: Secondary | ICD-10-CM | POA: Diagnosis not present

## 2024-02-08 DIAGNOSIS — M9902 Segmental and somatic dysfunction of thoracic region: Secondary | ICD-10-CM | POA: Diagnosis not present

## 2024-02-08 DIAGNOSIS — M9901 Segmental and somatic dysfunction of cervical region: Secondary | ICD-10-CM | POA: Diagnosis not present

## 2024-02-09 DIAGNOSIS — D225 Melanocytic nevi of trunk: Secondary | ICD-10-CM | POA: Diagnosis not present

## 2024-02-09 DIAGNOSIS — Z85828 Personal history of other malignant neoplasm of skin: Secondary | ICD-10-CM | POA: Diagnosis not present

## 2024-02-09 DIAGNOSIS — L814 Other melanin hyperpigmentation: Secondary | ICD-10-CM | POA: Diagnosis not present

## 2024-02-09 DIAGNOSIS — D2239 Melanocytic nevi of other parts of face: Secondary | ICD-10-CM | POA: Diagnosis not present

## 2024-02-16 DIAGNOSIS — M7042 Prepatellar bursitis, left knee: Secondary | ICD-10-CM | POA: Diagnosis not present

## 2024-02-16 DIAGNOSIS — F4322 Adjustment disorder with anxiety: Secondary | ICD-10-CM | POA: Diagnosis not present

## 2024-02-16 DIAGNOSIS — M9902 Segmental and somatic dysfunction of thoracic region: Secondary | ICD-10-CM | POA: Diagnosis not present

## 2024-02-16 DIAGNOSIS — M9903 Segmental and somatic dysfunction of lumbar region: Secondary | ICD-10-CM | POA: Diagnosis not present

## 2024-02-16 DIAGNOSIS — M9901 Segmental and somatic dysfunction of cervical region: Secondary | ICD-10-CM | POA: Diagnosis not present

## 2024-02-16 DIAGNOSIS — M7041 Prepatellar bursitis, right knee: Secondary | ICD-10-CM | POA: Diagnosis not present

## 2024-02-16 DIAGNOSIS — R0782 Intercostal pain: Secondary | ICD-10-CM | POA: Diagnosis not present

## 2024-02-22 DIAGNOSIS — F4322 Adjustment disorder with anxiety: Secondary | ICD-10-CM | POA: Diagnosis not present

## 2024-03-01 DIAGNOSIS — M25521 Pain in right elbow: Secondary | ICD-10-CM | POA: Diagnosis not present

## 2024-03-01 DIAGNOSIS — R293 Abnormal posture: Secondary | ICD-10-CM | POA: Diagnosis not present

## 2024-03-06 DIAGNOSIS — F419 Anxiety disorder, unspecified: Secondary | ICD-10-CM | POA: Diagnosis not present

## 2024-03-13 DIAGNOSIS — Z0000 Encounter for general adult medical examination without abnormal findings: Secondary | ICD-10-CM | POA: Diagnosis not present

## 2024-03-14 DIAGNOSIS — F419 Anxiety disorder, unspecified: Secondary | ICD-10-CM | POA: Diagnosis not present

## 2024-03-16 DIAGNOSIS — M25521 Pain in right elbow: Secondary | ICD-10-CM | POA: Diagnosis not present

## 2024-03-16 DIAGNOSIS — R293 Abnormal posture: Secondary | ICD-10-CM | POA: Diagnosis not present

## 2024-03-21 DIAGNOSIS — E538 Deficiency of other specified B group vitamins: Secondary | ICD-10-CM | POA: Diagnosis not present

## 2024-03-21 DIAGNOSIS — Z0000 Encounter for general adult medical examination without abnormal findings: Secondary | ICD-10-CM | POA: Diagnosis not present

## 2024-03-21 DIAGNOSIS — Z23 Encounter for immunization: Secondary | ICD-10-CM | POA: Diagnosis not present

## 2024-03-21 DIAGNOSIS — Z79899 Other long term (current) drug therapy: Secondary | ICD-10-CM | POA: Diagnosis not present

## 2024-03-21 DIAGNOSIS — Z13220 Encounter for screening for lipoid disorders: Secondary | ICD-10-CM | POA: Diagnosis not present

## 2024-04-09 DIAGNOSIS — M25521 Pain in right elbow: Secondary | ICD-10-CM | POA: Diagnosis not present

## 2024-04-09 DIAGNOSIS — R293 Abnormal posture: Secondary | ICD-10-CM | POA: Diagnosis not present

## 2024-04-11 DIAGNOSIS — Z23 Encounter for immunization: Secondary | ICD-10-CM | POA: Diagnosis not present

## 2024-04-12 ENCOUNTER — Ambulatory Visit (INDEPENDENT_AMBULATORY_CARE_PROVIDER_SITE_OTHER)

## 2024-04-12 ENCOUNTER — Other Ambulatory Visit: Payer: Self-pay

## 2024-04-12 ENCOUNTER — Encounter: Payer: Self-pay | Admitting: Family Medicine

## 2024-04-12 ENCOUNTER — Ambulatory Visit (INDEPENDENT_AMBULATORY_CARE_PROVIDER_SITE_OTHER): Admitting: Family Medicine

## 2024-04-12 VITALS — BP 138/82 | HR 78 | Ht 61.0 in | Wt 138.0 lb

## 2024-04-12 DIAGNOSIS — G8929 Other chronic pain: Secondary | ICD-10-CM

## 2024-04-12 DIAGNOSIS — M25571 Pain in right ankle and joints of right foot: Secondary | ICD-10-CM

## 2024-04-12 DIAGNOSIS — M25561 Pain in right knee: Secondary | ICD-10-CM | POA: Diagnosis not present

## 2024-04-12 NOTE — Patient Instructions (Addendum)
 Thank you for coming in today.   Please use Voltaren gel (Generic Diclofenac Gel) up to 4x daily for pain as needed.  This is available over-the-counter as both the name brand Voltaren gel and the generic diclofenac gel.   I've referred you to Physical Therapy.  Let us  know if you don't hear from them in one week.   Please get an Xray today before you leave   Check back in 6 weeks

## 2024-04-12 NOTE — Progress Notes (Unsigned)
 I, Leotis Batter, CMA acting as a scribe for Artist Lloyd, MD.  Andrea Reyes is a 53 y.o. female who presents to [facility] today for R ankle pain x 3 months. Started after inversion injury at the gym. TTP. Pt locates pain to lateral aspect of the ankle  R ankle swelling: denies Aggravates: present Treatments tried: Dry-needling and PT  Pt also c/o R knee pain x 3 weeks. Pt locates pain to lateral aspect of the knee. Changed from [place] machine to stationary machine while lunging. Lunges tend to flare knee sx up. Unable to squat d/t pain.   R Knee swelling: denies Mechanical symptoms: denies Radiates: localized  Both daughter's have hEDS  Pertinent review of systems: No fevers or chills  Relevant historical information: Acoustic neuroma. Family history of hypermobile Erler's Danlos syndrome.   Exam:  BP 138/82   Pulse 78   Ht 5' 1 (1.549 m)   Wt 138 lb (62.6 kg)   LMP 10/15/2020 Comment: irregular  SpO2 98%   BMI 26.07 kg/m  General: Well Developed, well nourished, and in no acute distress.   MSK: Hypermobile evaluation is positive with a Beighton score of 4/9. Ehlers-Danlos evaluation is positive with a score of 5.  Right ankle normal-appearing normal motion.  Intact strength. Minimal laxity to talar tilt test and anterior drawer test.  Right knee normal-appearing mildly tender palpation lateral knee intact strength stable ligamentous exam.     Lab and Radiology Results  X-ray images right knee and right ankle obtained today personally and independently interpreted.  Right knee: No significant degeneration.  No acute fractures are visible.  Right ankle: No significant degeneration.  No acute fractures are visible.  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  This is occurring in the setting of ankle and knee pain on the right side.  She does have a  little bit of ankle instability which I think is causing a gait change which is producing the knee pain.  She is a good candidate for physical therapy.  Refer to PT focusing on hypermobility syndrome.  Recommend stabilizing ankle brace and diclofenac gel.  Recheck in 6 weeks.  PDMP not reviewed this encounter. Orders Placed This Encounter  Procedures   DG Knee AP/LAT W/Sunrise Right    Standing Status:   Future    Number of Occurrences:   1    Expiration Date:   05/12/2024    Reason for Exam (SYMPTOM  OR DIAGNOSIS REQUIRED):   right knee pain    Preferred imaging location?:   [place]    Is patient pregnant?:   No   DG Ankle Complete Right    Standing Status:   Future    Number of Occurrences:   1    Expiration Date:   04/12/2025    Reason for Exam (SYMPTOM  OR DIAGNOSIS REQUIRED):   right ankle pain    Preferred imaging location?:   [place]    Is patient pregnant?:   No   Ambulatory referral to Physical Therapy    Referral Priority:   Routine    Referral Type:   Physical Medicine    Referral Reason:   Specialty Services Required    Requested Specialty:   Physical Therapy    Number of Visits Requested:   1   No orders of the defined types were placed in this encounter.  Discussed warning signs or symptoms. Please see discharge instructions. Patient expresses understanding.   The above documentation has been reviewed and is accurate and complete Artist Lloyd, M.D.

## 2024-04-14 DIAGNOSIS — J069 Acute upper respiratory infection, unspecified: Secondary | ICD-10-CM | POA: Diagnosis not present

## 2024-04-14 DIAGNOSIS — J029 Acute pharyngitis, unspecified: Secondary | ICD-10-CM | POA: Diagnosis not present

## 2024-04-14 DIAGNOSIS — Z6823 Body mass index (BMI) 23.0-23.9, adult: Secondary | ICD-10-CM | POA: Diagnosis not present

## 2024-04-14 DIAGNOSIS — R0981 Nasal congestion: Secondary | ICD-10-CM | POA: Diagnosis not present

## 2024-04-17 DIAGNOSIS — F419 Anxiety disorder, unspecified: Secondary | ICD-10-CM | POA: Diagnosis not present

## 2024-04-19 ENCOUNTER — Ambulatory Visit: Payer: Self-pay | Admitting: Family Medicine

## 2024-04-19 NOTE — Progress Notes (Signed)
Right ankle x-ray looks normal to radiology

## 2024-04-19 NOTE — Progress Notes (Signed)
Right knee x-ray looks normal to radiology

## 2024-05-03 DIAGNOSIS — F419 Anxiety disorder, unspecified: Secondary | ICD-10-CM | POA: Diagnosis not present

## 2024-05-04 DIAGNOSIS — R293 Abnormal posture: Secondary | ICD-10-CM | POA: Diagnosis not present

## 2024-05-04 DIAGNOSIS — M25521 Pain in right elbow: Secondary | ICD-10-CM | POA: Diagnosis not present

## 2024-05-08 DIAGNOSIS — F419 Anxiety disorder, unspecified: Secondary | ICD-10-CM | POA: Diagnosis not present

## 2024-05-09 ENCOUNTER — Encounter: Payer: Self-pay | Admitting: Physical Therapy

## 2024-05-09 ENCOUNTER — Ambulatory Visit (INDEPENDENT_AMBULATORY_CARE_PROVIDER_SITE_OTHER): Admitting: Physical Therapy

## 2024-05-09 DIAGNOSIS — M79605 Pain in left leg: Secondary | ICD-10-CM | POA: Diagnosis not present

## 2024-05-09 NOTE — Therapy (Signed)
 OUTPATIENT PHYSICAL THERAPY LOWER EXTREMITY EVALUATION   Patient Name: Andrea Reyes MRN: [id number] DOB:1970/10/15, 53 y.o., female Today's Date: 05/09/2024  END OF SESSION:  PT End of Session - 05/09/24 0810     Visit Number 1    Number of Visits 5    Date for Recertification  07/04/24    Authorization Type BCBS    PT Start Time 0800    PT Stop Time 0850    PT Time Calculation (min) 50 min    Activity Tolerance Patient tolerated treatment well    Behavior During Therapy WFL for tasks assessed/performed          Past Medical History:  Diagnosis Date   Allergy    Anemia    Anxiety    Arthritis    Basal cell carcinoma    Depression    Ehlers-Danlos, hypermobile type    Eosinophilic esophagitis    GERD (gastroesophageal reflux disease)    Hyperlipidemia    Laryngopharyngeal reflux (LPR)    Vestibular schwannoma (HCC)    Past Surgical History:  Procedure Laterality Date   BUNIONECTOMY Left    on top of left big toe   COLONOSCOPY     EXCISION VAGINAL CYST     SEPTOPLASTY  09/2018   UPPER GASTROINTESTINAL ENDOSCOPY     Patient Active Problem List   Diagnosis Date Noted   Laryngopharyngeal reflux 09/24/2023   Sensorineural hearing loss, bilateral 09/24/2023   Chronic sinusitis 04/24/2018   Perennial allergic rhinitis with a probable nonallergic component 04/24/2018   Acoustic neuroma (HCC) 04/24/2018    PCP: Rolinda Millman MD   REFERRING PROVIDER: Joane Birmingham MD   REFERRING DIAG: [phone] (ICD-10-CM) - Chronic pain of right ankle M25.561,G89.29 (ICD-10-CM) - Chronic pain of right knee  THERAPY DIAG:  Pain of left lower extremity  Rationale for Evaluation and Treatment: Rehabilitation  ONSET DATE: 4-5 mos   SUBJECTIVE:   SUBJECTIVE STATEMENT: Patient is here for Rt knee and ankle pain in the setting of hypermobility.  Both of her children have hypermobile Ehlers-Danlos syndrome and she was told that she may have this as well.  While using the  [facility] machine 4-5 mos ago the bar came down and she twisted her ankle and knee.  She saw the doctor due to lateral knee pain after that.    Yeah she is working with a Systems analyst for 18 mos.  2 x per week.  She also does 3 strength days.  She wants a lower impact cardio option.   She has also seen Darlynn Pacini and another PT for this.  She has difficulty with her neck and shoulders at times, due to muscle imbalances and hypermobility syndrome.   She was told that the L side of her body is not working like the Rt side    PERTINENT HISTORY: Vestibular schwannoma    PAIN:  Are you having pain? Yes: NPRS scale: no pain in ankle or knee  Pain location: L lateral knee, L lateral ankle  Pain description: sore Aggravating factors: inversion of ankle,  Relieving factors: rest?  Had some pain Friday.    PRECAUTIONS: None  RED FLAGS: None   WEIGHT BEARING RESTRICTIONS: No  FALLS:  Has patient fallen in last 6 months? 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  DIAGNOSTIC FINDINGS: normal XR in knee and ankle.   PATIENT SURVEYS:  NT   COGNITION: Overall cognitive status: Within functional limits for tasks assessed     SENSATION: WFL  EDEMA:  None   MUSCLE LENGTH: Hamstrings: WNL   POSTURE: low L shoulder, L pelvis retracted    PALPATION: Min tenderness lateral ankle below lateral calcaneus   LOWER EXTREMITY ROM: WNL  Good hip ER and IR but not hypermobile  Active ROM Right eval Left eval  Hip flexion    Hip extension    Hip abduction    Hip adduction    Hip internal rotation    Hip external rotation    Knee flexion    Knee extension +7 +7  Ankle dorsiflexion    Ankle plantarflexion    Ankle  inversion    Ankle eversion     (Blank rows = not tested)  LOWER EXTREMITY MMT:  MMT Right eval Left eval  Hip flexion    Hip extension    Hip abduction    Hip adduction    Hip internal rotation    Hip external rotation    Knee flexion    Knee extension    Ankle dorsiflexion    Ankle plantarflexion    Ankle inversion    Ankle eversion     (Blank rows = not tested)  LOWER EXTREMITY SPECIAL TESTS:  Hip special tests: Ober's test: negative  FUNCTIONAL TESTS:  Squat asymmetry noted, weight shifted to Rt LE   SLS WNL, more effort and less stable on L side  GAIT: Distance walked: 150+ Assistive device utilized: None Level of assistance: Complete Independence Comments: no deviations noted in clinic                                                                                                      TREATMENT DATE:   05/09/24  PT eval completed, discussed plan to return to this location for a few sessions focusing on overall body symmetry posture alignment core control and use of Pilates equipment for neuromuscular reeducation. Spent time discussing recommendations for structuring her weekly fitness routine  PATIENT EDUCATION:  Education details: see above Person educated: Patient Education method: Programmer, multimedia, Demonstration, and Handouts Education comprehension: verbalized understanding, returned demonstration, and needs further education  HOME EXERCISE PROGRAM: Not done on eval.   ASSESSMENT:  CLINICAL IMPRESSION: Patient is a 53 y.o. female who was seen today for physical therapy evaluation and treatment for right knee and right ankle pain.  Patient is interested in returning to physical therapy for treatment of her hypermobility syndrome and addressing muscular imbalances.  OBJECTIVE IMPAIRMENTS: decreased balance, decreased coordination, increased fascial restrictions, impaired UE functional use, and pain.   ACTIVITY LIMITATIONS: squatting and locomotion  level  PARTICIPATION LIMITATIONS: community activity  PERSONAL FACTORS: 1 comorbidity: History of hypermobile joints are also affecting patient's functional outcome.   REHAB POTENTIAL: Excellent  CLINICAL DECISION MAKING: Stable/uncomplicated  EVALUATION COMPLEXITY: Low   GOALS: Goals reviewed with patient? Yes  LONG TERM GOALS:  Target date: 06/20/2024    Patient will be independent with final HEP upon discharge from PT and report consistent benefit following exercise completion.    Baseline:  Goal status: INITIAL  2.  Patient will be able to demonstrate proper posture and lifting techniques related to spine health and reduction of symptoms.   Baseline:  Goal status: INITIAL  3.  Patient will return to community based exercise program with modifications if necessary for general health and fitness.  Baseline:  Goal status: INITIAL  4.  Patient will demonstrate symmetrical squat without increased pain in right knee Baseline:  Goal status: INITIAL  5.  Patient will show symmetrical dynamic balance activities for single-leg stance Baseline:  Goal status: INITIAL    PLAN:  PT FREQUENCY: 1x/week  PT DURATION: 4 weeks  PLANNED INTERVENTIONS: 97164- PT Re-evaluation, 97750- Physical Performance Testing, 97110-Therapeutic exercises, 97530- Therapeutic activity, [id number]- Neuromuscular re-education, 97535- Self Care, [id number]- Manual therapy, Patient/Family education, Balance training, Cryotherapy, and Moist heat  PLAN FOR NEXT SESSION: Land , core HEP    Bryton Waight, PT 05/09/2024, 10:29 AM   Delon Norma, PT 05/09/24 10:49 AM Phone: [phone] Fax: [phone]

## 2024-05-15 ENCOUNTER — Ambulatory Visit (INDEPENDENT_AMBULATORY_CARE_PROVIDER_SITE_OTHER): Admitting: Physical Therapy

## 2024-05-15 DIAGNOSIS — M79605 Pain in left leg: Secondary | ICD-10-CM

## 2024-05-15 NOTE — Telephone Encounter (Signed)
 Patient missed her appt at 1:00 due to scheduling error  She was offered 1:45 appt today and she can make it.   Delon Norma, PT 05/15/24 1:24 PM Phone: [phone] Fax: [phone]

## 2024-05-15 NOTE — Therapy (Signed)
 OUTPATIENT PHYSICAL THERAPY LOWER EXTREMITY EVALUATION   Patient Name: Andrea Reyes MRN: [id number] DOB:10-17-1970, 53 y.o., female Today's Date: 05/15/2024  END OF SESSION:  PT End of Session - 05/15/24 1402     Visit Number 2    Number of Visits 5    Date for Recertification  07/04/24    Authorization Type BCBS    Authorization Time Period Submitted 10/9 for 10/8    PT Start Time 1347    PT Stop Time 1434    PT Time Calculation (min) 47 min           Past Medical History:  Diagnosis Date   Allergy    Anemia    Anxiety    Arthritis    Basal cell carcinoma    Depression    Ehlers-Danlos, hypermobile type    Eosinophilic esophagitis    GERD (gastroesophageal reflux disease)    Hyperlipidemia    Laryngopharyngeal reflux (LPR)    Vestibular schwannoma (HCC)    Past Surgical History:  Procedure Laterality Date   BUNIONECTOMY Left    on top of left big toe   COLONOSCOPY     EXCISION VAGINAL CYST     SEPTOPLASTY  09/2018   UPPER GASTROINTESTINAL ENDOSCOPY     Patient Active Problem List   Diagnosis Date Noted   Laryngopharyngeal reflux 09/24/2023   Sensorineural hearing loss, bilateral 09/24/2023   Chronic sinusitis 04/24/2018   Perennial allergic rhinitis with a probable nonallergic component 04/24/2018   Acoustic neuroma (HCC) 04/24/2018    PCP: Rolinda Millman MD   REFERRING PROVIDER: Joane Birmingham MD   REFERRING DIAG: [phone] (ICD-10-CM) - Chronic pain of right ankle M25.561,G89.29 (ICD-10-CM) - Chronic pain of right knee  THERAPY DIAG:  Pain of left lower extremity  Rationale for Evaluation and Treatment: Rehabilitation  ONSET DATE: 4-5 mos   SUBJECTIVE:   SUBJECTIVE STATEMENT:  Pt here with mild knee pain today.   Patient is here for Rt knee and ankle pain in the setting of hypermobility.  Both of her children have hypermobile Ehlers-Danlos syndrome and she was told that she may have this as well.  While using the [employer] machine 4-5 mos  ago the bar came down and she twisted her ankle and knee.  She saw the doctor due to lateral knee pain after that.    Yeah she is working with a Systems analyst for 18 mos.  2 x per week.  She also does 3 strength days.  She wants a lower impact cardio option.   She has also seen Darlynn Pacini and another PT for this.  She has difficulty with her neck and shoulders at times, due to muscle imbalances and hypermobility syndrome.   She was told that the L side of her body is not working like the Rt side    PERTINENT HISTORY: Vestibular schwannoma    PAIN:  Are you having pain? Yes: NPRS scale: no pain in ankle or knee  Pain location: L lateral knee, L lateral ankle  Pain description: sore Aggravating factors: inversion of ankle,  Relieving factors: rest?  Had some pain Friday.    PRECAUTIONS: None  RED FLAGS: None   WEIGHT BEARING RESTRICTIONS: No  FALLS:  Has patient fallen in last 6 months? 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  DIAGNOSTIC FINDINGS: normal XR in knee and ankle.   PATIENT SURVEYS:  NT   COGNITION: Overall cognitive status: Within functional limits for tasks assessed     SENSATION: WFL  EDEMA:  None   MUSCLE LENGTH: Hamstrings: WNL   POSTURE: low L shoulder, L pelvis retracted    PALPATION: Min tenderness lateral ankle below lateral calcaneus   LOWER EXTREMITY ROM: WNL  Good hip ER and IR but not hypermobile  Active ROM Right eval Left eval  Hip flexion    Hip extension    Hip abduction    Hip adduction    Hip internal rotation    Hip external rotation    Knee flexion    Knee extension +7 +7  Ankle dorsiflexion    Ankle plantarflexion    Ankle inversion    Ankle  eversion     (Blank rows = not tested)  LOWER EXTREMITY MMT:  MMT Right eval Left eval  Hip flexion    Hip extension    Hip abduction    Hip adduction    Hip internal rotation    Hip external rotation    Knee flexion    Knee extension    Ankle dorsiflexion    Ankle plantarflexion    Ankle inversion    Ankle eversion     (Blank rows = not tested)  LOWER EXTREMITY SPECIAL TESTS:  Hip special tests: Ober's test: negative  FUNCTIONAL TESTS:  Squat asymmetry noted, weight shifted to Rt LE   SLS WNL, more effort and less stable on L side  GAIT: Distance walked: 150+ Assistive device utilized: None Level of assistance: Complete Independence Comments: no deviations noted in clinic                                                                                                      TREATMENT DATE:    [facility] Adult PT Treatment:                                                DATE: 05/15/24 Manual Therapy: IASTM Rt lateral hip , ITB and TFL Neuromuscular re-ed: Core activation TrA via A/P tilt  Nov 06, 2023  SLR with UE (dead bug variation) 90/90 Bridging with ball  Figure 4  Footwork  Double leg Parallel heels, toes narrow and wide Pilates V heels and toes narrow and wide  2 red 1 blue 1 yellow  Single leg   05/09/24  PT eval completed, discussed plan to return to this location for a few sessions focusing on overall body symmetry posture alignment core control and use of Pilates equipment for neuromuscular reeducation. Spent time discussing recommendations for structuring her weekly fitness routine  PATIENT EDUCATION:  Education details: see above Person educated: Patient Education method: Explanation, Demonstration, and Handouts Education comprehension: verbalized understanding, returned demonstration, and needs further education  HOME EXERCISE PROGRAM: Has from  previous PT, may add on at a later date.   ASSESSMENT:  CLINICAL IMPRESSION:  Patient has Rt anterior  knee pain today with basic footwork, but pain was more lateral when she arrived.  Introduced her to [employer] and educated on fine tuning her core stability, proprioception to get more out of her workouts.  She tends to posteriorly tilt pelvis and loses her deep core engagement.  Cues given to slow the pace and calm breath.  Patient will continue to benefit from skilled PT in order to return to PLOF and optimize functional mobility.     Patient is a 53 y.o. female who was seen today for physical therapy evaluation and treatment for right knee and right ankle pain.  Patient is interested in returning to physical therapy for treatment of her hypermobility syndrome and addressing muscular imbalances.  OBJECTIVE IMPAIRMENTS: decreased balance, decreased coordination, increased fascial restrictions, impaired UE functional use, and pain.   ACTIVITY LIMITATIONS: squatting and locomotion level  PARTICIPATION LIMITATIONS: community activity  PERSONAL FACTORS: 1 comorbidity: History of hypermobile joints are also affecting patient's functional outcome.   REHAB POTENTIAL: Excellent  CLINICAL DECISION MAKING: Stable/uncomplicated  EVALUATION COMPLEXITY: Low   GOALS: Goals reviewed with patient? Yes  LONG TERM GOALS: Target date: 06/20/2024    Patient will be independent with final HEP upon discharge from PT and report consistent benefit following exercise completion.    Baseline:  Goal status: INITIAL  2.  Patient will be able to demonstrate proper posture and lifting techniques related to spine health and reduction of symptoms.   Baseline:  Goal status: INITIAL  3.  Patient will return to community based exercise program with modifications if necessary for general health and fitness.  Baseline:  Goal status: INITIAL  4.  Patient will demonstrate symmetrical squat without increased pain in right knee Baseline:  Goal status: INITIAL  5.  Patient will show symmetrical dynamic  balance activities for single-leg stance Baseline:  Goal status: INITIAL    PLAN:  PT FREQUENCY: 1x/week  PT DURATION: 4 weeks  PLANNED INTERVENTIONS: 97164- PT Re-evaluation, 97750- Physical Performance Testing, 97110-Therapeutic exercises, 97530- Therapeutic activity, [id number]- Neuromuscular re-education, 97535- Self Care, [id number]- Manual therapy, Patient/Family education, Balance training, Cryotherapy, and Moist heat  PLAN FOR NEXT SESSION: Land , core HEP    Samyrah Bruster, PT 05/15/2024, 3:07 PM   Delon Norma, PT 05/15/24 3:07 PM Phone: [phone] Fax: [phone]

## 2024-05-16 DIAGNOSIS — F419 Anxiety disorder, unspecified: Secondary | ICD-10-CM | POA: Diagnosis not present

## 2024-05-22 DIAGNOSIS — R293 Abnormal posture: Secondary | ICD-10-CM | POA: Diagnosis not present

## 2024-05-22 DIAGNOSIS — M25521 Pain in right elbow: Secondary | ICD-10-CM | POA: Diagnosis not present

## 2024-05-23 ENCOUNTER — Ambulatory Visit (INDEPENDENT_AMBULATORY_CARE_PROVIDER_SITE_OTHER): Admitting: Physical Therapy

## 2024-05-23 ENCOUNTER — Encounter: Payer: Self-pay | Admitting: Physical Therapy

## 2024-05-23 DIAGNOSIS — F419 Anxiety disorder, unspecified: Secondary | ICD-10-CM | POA: Diagnosis not present

## 2024-05-23 DIAGNOSIS — M79605 Pain in left leg: Secondary | ICD-10-CM

## 2024-05-23 NOTE — Therapy (Signed)
 OUTPATIENT PHYSICAL THERAPY LOWER EXTREMITY NOTE    Patient Name: Andrea Reyes MRN: [id number] DOB:11-04-1970, 53 y.o., female Today's Date: 05/23/2024  END OF SESSION:  PT End of Session - 05/23/24 0807     Visit Number 3    Number of Visits 5    Date for Recertification  07/04/24    Authorization Type BC[employer]    Authorization Time Period Submitted 10/9 for 10/8    PT Start Time 0805    PT Stop Time 0845    PT Time Calculation (min) 40 min    Activity Tolerance Patient tolerated treatment well    Behavior During Therapy [facility] for tasks assessed/performed           Past Medical History:  Diagnosis Date   Allergy    Anemia    Anxiety    Arthritis    Basal cell carcinoma    Depression    Ehlers-Danlos, hypermobile type    Eosinophilic esophagitis    GERD (gastroesophageal reflux disease)    Hyperlipidemia    Laryngopharyngeal reflux (LPR)    Vestibular schwannoma (HCC)    Past Surgical History:  Procedure Laterality Date   BUNIONECTOMY Left    on top of left big toe   COLONOSCOPY     EXCISION VAGINAL CYST     SEPTOPLASTY  09/2018   UPPER GASTROINTESTINAL ENDOSCOPY     Patient Active Problem List   Diagnosis Date Noted   Laryngopharyngeal reflux 09/24/2023   Sensorineural hearing loss, bilateral 09/24/2023   Chronic sinusitis 04/24/2018   Perennial allergic rhinitis with a probable nonallergic component 04/24/2018   Acoustic neuroma (HCC) 04/24/2018    PCP: Rolinda Millman MD   REFERRING PROVIDER: Joane Birmingham MD   REFERRING DIAG: [phone] (ICD-10-CM) - Chronic pain of right ankle M25.561,G89.29 (ICD-10-CM) - Chronic pain of right knee  THERAPY DIAG:  Pain of left lower extremity  Rationale for Evaluation and Treatment: Rehabilitation  ONSET DATE: 4-5 mos   SUBJECTIVE:   SUBJECTIVE STATEMENT:  No pain today.  She was at the beach and did not do as much of her exercises as she normally does.      Patient is here for Rt knee and ankle pain  in the setting of hypermobility.  Both of her children have hypermobile Ehlers-Danlos syndrome and she was told that she may have this as well.  While using the [facility] machine 4-5 mos ago the bar came down and she twisted her ankle and knee.  She saw the doctor due to lateral knee pain after that.    Yeah she is working with a Systems analyst for 18 mos.  2 x per week.  She also does 3 strength days.  She wants a lower impact cardio option.   She has also seen Darlynn Pacini and another PT for this.  She has difficulty with her neck and shoulders at times, due to muscle imbalances and hypermobility syndrome.   She was told that the L side of her body is not working like the Rt side    PERTINENT HISTORY: Vestibular schwannoma    PAIN:  Are you having pain? Yes: NPRS scale: no pain in ankle or knee  Pain location: L lateral knee, L lateral ankle  Pain description: sore Aggravating factors: inversion of ankle,  Relieving factors: rest?  Had some pain Friday.    PRECAUTIONS: None  RED FLAGS: None   WEIGHT BEARING RESTRICTIONS: No  FALLS:  Has patient fallen in last 6 months?  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  DIAGNOSTIC FINDINGS: normal XR in knee and ankle.   PATIENT SURVEYS:  NT   COGNITION: Overall cognitive status: Within functional limits for tasks assessed     SENSATION: WFL  EDEMA:  None   MUSCLE LENGTH: Hamstrings: WNL   POSTURE: low L shoulder, L pelvis retracted    PALPATION: Min tenderness lateral ankle below lateral calcaneus   LOWER EXTREMITY ROM: WNL  Good hip ER and IR but not hypermobile  Active ROM Right eval Left eval  Hip flexion    Hip extension    Hip abduction    Hip  adduction    Hip internal rotation    Hip external rotation    Knee flexion    Knee extension +7 +7  Ankle dorsiflexion    Ankle plantarflexion    Ankle inversion    Ankle eversion     (Blank rows = not tested)  LOWER EXTREMITY MMT:  MMT Right eval Left eval  Hip flexion    Hip extension    Hip abduction    Hip adduction    Hip internal rotation    Hip external rotation    Knee flexion    Knee extension    Ankle dorsiflexion    Ankle plantarflexion    Ankle inversion    Ankle eversion     (Blank rows = not tested)  LOWER EXTREMITY SPECIAL TESTS:  Hip special tests: Ober's test: negative  FUNCTIONAL TESTS:  Squat asymmetry noted, weight shifted to Rt LE   SLS WNL, more effort and less stable on L side  GAIT: Distance walked: 150+ Assistive device utilized: None Level of assistance: Complete Independence Comments: no deviations noted in clinic                                                                                                      TREATMENT DATE:    [facility] Adult PT Treatment:                                                DATE: 05/23/24 Neuromuscular re-ed: Pilates Reformer used for LE/core strength, postural strength, lumbopelvic disassociation and core control.  Exercises included: Footwork  Double leg Parallel heels, toes narrow and wide Pilates V heels and toes narrow and wide  2 red 1 blue 1 yellow  Single leg  Used ball under pelvis added SLR x 10  90/90 hip extension ball under hips  Banded bent knee fall out x 15  Bridging blue band on thighs  Supine Arm work 1 red 1 yellow arcs in parallel, V   Feet in Straps 1 red 1 yellow Arcs in parallel and V  Frog squats x 10 Circles  x 10 each direction   [facility] Adult PT Treatment:                                                DATE: 05/15/24 Manual Therapy: IASTM Rt lateral hip , ITB and TFL Neuromuscular re-ed: Core activation TrA via A/P tilt  20-Nov-2023  SLR with UE (dead bug  variation) 90/90 Bridging with ball  Figure 4  Footwork  Double leg Parallel heels, toes narrow and wide Pilates V heels and toes narrow and wide  2 red 1 blue 1 yellow  Single leg   05/09/24  PT eval completed, discussed plan to return to this location for a few sessions focusing on overall body symmetry posture alignment core control and use of Pilates equipment for neuromuscular reeducation. Spent time discussing recommendations for structuring her weekly fitness routine  PATIENT EDUCATION:  Education details: see above Person educated: Patient Education method: Explanation, Demonstration, and Handouts Education comprehension: verbalized understanding, returned demonstration, and needs further education  HOME EXERCISE PROGRAM: Has from previous PT, may add on at a later date.   ASSESSMENT:  CLINICAL IMPRESSION: Patient did well after first Pilates session, today we added more exercises and gave her more opportunity for hip mobility, core control.   She continues to need cues to let go on neck tension with both upper and lower body exercises.  Cues given to slow the pace and calm breath.  Patient will continue to benefit from skilled PT in order to return to PLOF and optimize functional mobility.     Patient is a 53 y.o. female who was seen today for physical therapy evaluation and treatment for right knee and right ankle pain.  Patient is interested in returning to physical therapy for treatment of her hypermobility syndrome and addressing muscular imbalances.  OBJECTIVE IMPAIRMENTS: decreased balance, decreased coordination, increased fascial restrictions, impaired UE functional use, and pain.   ACTIVITY LIMITATIONS: squatting and locomotion level  PARTICIPATION LIMITATIONS: community activity  PERSONAL FACTORS: 1 comorbidity: History of hypermobile joints are also affecting patient's functional outcome.   REHAB POTENTIAL: Excellent  CLINICAL DECISION MAKING:  Stable/uncomplicated  EVALUATION COMPLEXITY: Low   GOALS: Goals reviewed with patient? Yes  LONG TERM GOALS: Target date: 06/20/2024    Patient will be independent with final HEP upon discharge from PT and report consistent benefit following exercise completion.    Baseline:  Goal status: INITIAL  2.  Patient will be able to demonstrate proper posture and lifting techniques related to spine health and reduction of symptoms.   Baseline:  Goal status: INITIAL  3.  Patient will return to community based exercise program with modifications if necessary for general health and fitness.  Baseline:  Goal status: INITIAL  4.  Patient will demonstrate symmetrical squat without increased pain in right knee Baseline:  Goal status: INITIAL  5.  Patient will show symmetrical dynamic balance activities for single-leg stance Baseline:  Goal status: INITIAL    PLAN:  PT FREQUENCY: 1x/week  PT DURATION: 4 weeks  PLANNED INTERVENTIONS: 97164- PT Re-evaluation, 97750- Physical Performance Testing, 97110-Therapeutic exercises, 97530- Therapeutic activity, [id number]- Neuromuscular re-education, 97535- Self Care, [id number]- Manual therapy, Patient/Family education, Balance training, Cryotherapy, and Moist heat  PLAN FOR NEXT SESSION: Land , core HEP    Reymundo Winship, PT 05/23/2024, 8:54 AM   Delon  Gayl Ivanoff, PT 05/23/24 8:54 AM Phone: [phone] Fax: [phone]

## 2024-05-24 ENCOUNTER — Ambulatory Visit: Admitting: Family Medicine

## 2024-05-24 ENCOUNTER — Other Ambulatory Visit: Payer: Self-pay

## 2024-05-24 VITALS — BP 108/70 | HR 79 | Ht 61.0 in | Wt 134.0 lb

## 2024-05-24 DIAGNOSIS — M25561 Pain in right knee: Secondary | ICD-10-CM

## 2024-05-24 DIAGNOSIS — G8929 Other chronic pain: Secondary | ICD-10-CM

## 2024-05-24 DIAGNOSIS — M25571 Pain in right ankle and joints of right foot: Secondary | ICD-10-CM | POA: Diagnosis not present

## 2024-05-24 DIAGNOSIS — Q7962 Hypermobile Ehlers-Danlos syndrome: Secondary | ICD-10-CM | POA: Insufficient documentation

## 2024-05-24 NOTE — Progress Notes (Signed)
   Andrea Ileana Collet, PhD, LAT, ATC acting as a scribe for Artist Lloyd, MD.  Andrea Reyes is a 53 y.o. female who presents to [facility] today for f/u R ankle and R knee pain in the setting of hEDS. Pt was last seen by Dr. Lloyd on 04/12/24 and was advised to wear an ankle brace, use diclofenac gel, and was referred to PT, completing 3 visits.  Today, pt reports she forgot about being advised to get an ankle brace. Knee and ankle pain is about the same, slightly improved. PT was wondering if her knee band might be related to her IT band. She has stopped doing squats w/ her personal training.   Dx testing: 04/12/24 R ankle & R knee pain  Pertinent review of systems: No fevers or chills  Relevant historical information: Acoustic neuroma   Exam:  BP 108/70   Pulse 79   Ht 5' 1 (1.549 m)   Wt 134 lb (60.8 kg)   LMP 10/15/2020 Comment: irregular  SpO2 97%   BMI 25.32 kg/m  General: Well Developed, well nourished, and in no acute distress.   MSK: Right ankle: Normal.  Normal motion.  Some laxity.  Intact strength.    Lab and Radiology Results  Diagnostic Limited MSK Ultrasound of: Right lateral ankle Some hypoechoic fluid surrounds peroneal tendon within tendon sheath.  No visible tear is present. Impression: Right ankle peroneal tenosynovitis     Assessment and Plan: 53 y.o. female with right lateral ankle pain due to some instability and peroneal tenosynovitis.  Plan to continue PT and home exercise program.  Recommend body Helix ankle brace and Voltaren gel.  Check back as needed.   PDMP not reviewed this encounter. Orders Placed This Encounter  Procedures   US  LIMITED JOINT SPACE STRUCTURES LOW RIGHT(NO LINKED CHARGES)    Reason for Exam (SYMPTOM  OR DIAGNOSIS REQUIRED):   right knee pain    Preferred imaging location?:   [facility]   No orders of the defined types were placed in this  encounter.    Discussed warning signs or symptoms. Please see discharge instructions. Patient expresses understanding.   The above documentation has been reviewed and is accurate and complete Artist Lloyd, M.D.

## 2024-05-24 NOTE — Patient Instructions (Addendum)
 Thank you for coming in today.   Continue physical therapy  I recommend you obtained a compression sleeve to help with your joint problems. There are many options on the market however I recommend obtaining a Full Ankle Body Helix compression sleeve.  You can find information (including how to appropriate measure yourself for sizing) can be found at [email or web address].ch.  Many of these products are health savings account (HSA) eligible.   You can use the compression sleeve at any time throughout the day but is most important to use while being active as well as for 2 hours post-activity.   It is appropriate to ice following activity with the compression sleeve in place.   Check back as needed

## 2024-05-30 ENCOUNTER — Encounter: Admitting: Physical Therapy

## 2024-05-30 NOTE — Therapy (Deleted)
 OUTPATIENT PHYSICAL THERAPY LOWER EXTREMITY NOTE    Patient Name: Andrea Reyes MRN: [id number] DOB:[birth year], 53 y.o., female Today's Date: 05/30/2024  END OF SESSION:     Past Medical History:  Diagnosis Date   Allergy    Anemia    Anxiety    Arthritis    Basal cell carcinoma    Depression    Ehlers-Danlos, hypermobile type    Eosinophilic esophagitis    GERD (gastroesophageal reflux disease)    Hyperlipidemia    Laryngopharyngeal reflux (LPR)    Vestibular schwannoma (HCC)    Past Surgical History:  Procedure Laterality Date   BUNIONECTOMY Left    on top of left big toe   COLONOSCOPY     EXCISION VAGINAL CYST     SEPTOPLASTY  09/2018   UPPER GASTROINTESTINAL ENDOSCOPY     Patient Active Problem List   Diagnosis Date Noted   Hypermobile Ehlers-Danlos syndrome 05/24/2024   Laryngopharyngeal reflux 09/24/2023   Sensorineural hearing loss, bilateral 09/24/2023   Chronic sinusitis 04/24/2018   Perennial allergic rhinitis with a probable nonallergic component 04/24/2018   Acoustic neuroma (HCC) 04/24/2018    PCP: Rolinda Millman MD   REFERRING PROVIDER: Joane Birmingham MD   REFERRING DIAG: [phone] (ICD-10-CM) - Chronic pain of right ankle M25.561,G89.29 (ICD-10-CM) - Chronic pain of right knee  THERAPY DIAG:  No diagnosis found.  Rationale for Evaluation and Treatment: Rehabilitation  ONSET DATE: 4-5 mos   SUBJECTIVE:   SUBJECTIVE STATEMENT:  No pain today.  She was at the beach and did not do as much of her exercises as she normally does.      Patient is here for Rt knee and ankle pain in the setting of hypermobility.  Both of her children have hypermobile Ehlers-Danlos syndrome and she was told that she may have this as well.  While using the [facility] machine 4-5 mos ago the bar came down and she twisted her ankle and knee.  She saw the doctor due to lateral knee pain after that.    Yeah she is working with a systems analyst for 18 mos.  2 x per  week.  She also does 3 strength days.  She wants a lower impact cardio option.   She has also seen Darlynn Pacini and another PT for this.  She has difficulty with her neck and shoulders at times, due to muscle imbalances and hypermobility syndrome.   She was told that the L side of her body is not working like the Rt side    PERTINENT HISTORY: Vestibular schwannoma    PAIN:  Are you having pain? Yes: NPRS scale: no pain in ankle or knee  Pain location: L lateral knee, L lateral ankle  Pain description: sore Aggravating factors: inversion of ankle,  Relieving factors: rest?  Had some pain Friday.    PRECAUTIONS: None  RED FLAGS: None   WEIGHT BEARING RESTRICTIONS: No  FALLS:  Has patient fallen in last 6 months? 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  DIAGNOSTIC FINDINGS: normal XR in knee and ankle.   PATIENT SURVEYS:  NT   COGNITION: Overall cognitive status: Within functional limits for tasks assessed     SENSATION: WFL  EDEMA:  None   MUSCLE LENGTH: Hamstrings:  WNL   POSTURE: low L shoulder, L pelvis retracted    PALPATION: Min tenderness lateral ankle below lateral calcaneus   LOWER EXTREMITY ROM: WNL  Good hip ER and IR but not hypermobile  Active ROM Right eval Left eval  Hip flexion    Hip extension    Hip abduction    Hip adduction    Hip internal rotation    Hip external rotation    Knee flexion    Knee extension +7 +7  Ankle dorsiflexion    Ankle plantarflexion    Ankle inversion    Ankle eversion     (Blank rows = not tested)  LOWER EXTREMITY MMT:  MMT Right eval Left eval  Hip flexion    Hip extension    Hip abduction    Hip adduction    Hip internal rotation     Hip external rotation    Knee flexion    Knee extension    Ankle dorsiflexion    Ankle plantarflexion    Ankle inversion    Ankle eversion     (Blank rows = not tested)  LOWER EXTREMITY SPECIAL TESTS:  Hip special tests: Ober's test: negative  FUNCTIONAL TESTS:  Squat asymmetry noted, weight shifted to Rt LE   SLS WNL, more effort and less stable on L side  GAIT: Distance walked: 150+ Assistive device utilized: None Level of assistance: Complete Independence Comments: no deviations noted in clinic                                                                                                      TREATMENT DATE:   [facility] Adult PT Treatment:                                                DATE: 05/31/24 Therapeutic Exercise: *** Manual Therapy: *** Neuromuscular re-ed: *** Therapeutic Activity: *** Modalities: *** Self Care: ***  [facility] Adult PT Treatment:                                                DATE: 05/23/24 Neuromuscular re-ed: Pilates Reformer used for LE/core strength, postural strength, lumbopelvic disassociation and core control.  Exercises included: Footwork  Double leg Parallel heels, toes narrow and wide Pilates V heels and toes narrow and wide  2 red 1 blue 1 yellow  Single leg  Used ball under pelvis added SLR x 10  90/90 hip extension ball under hips  Banded bent knee fall out x 15  Bridging blue band on thighs  Supine Arm work 1 red 1 yellow arcs in parallel, V   Feet in Straps 1 red 1 yellow Arcs in parallel and V  Frog squats x 10 Circles  x 10 each direction   [facility] Adult PT Treatment:  DATE: 05/15/24 Manual Therapy: IASTM Rt lateral hip , ITB and TFL Neuromuscular re-ed: Core activation TrA via A/P tilt  15-Oct-2023  SLR with UE (dead bug variation) 90/90 Bridging with ball  Figure 4  Footwork  Double leg Parallel heels, toes narrow and wide Pilates V heels and toes narrow and wide  2  red 1 blue 1 yellow  Single leg   05/09/24  PT eval completed, discussed plan to return to this location for a few sessions focusing on overall body symmetry posture alignment core control and use of Pilates equipment for neuromuscular reeducation. Spent time discussing recommendations for structuring her weekly fitness routine  PATIENT EDUCATION:  Education details: see above Person educated: Patient Education method: Explanation, Demonstration, and Handouts Education comprehension: verbalized understanding, returned demonstration, and needs further education  HOME EXERCISE PROGRAM: Has from previous PT, may add on at a later date.   ASSESSMENT:  CLINICAL IMPRESSION: Patient did well after first Pilates session, today we added more exercises and gave her more opportunity for hip mobility, core control.   She continues to need cues to let go on neck tension with both upper and lower body exercises.  Cues given to slow the pace and calm breath.  Patient will continue to benefit from skilled PT in order to return to PLOF and optimize functional mobility.     Patient is a 53 y.o. female who was seen today for physical therapy evaluation and treatment for right knee and right ankle pain.  Patient is interested in returning to physical therapy for treatment of her hypermobility syndrome and addressing muscular imbalances.  OBJECTIVE IMPAIRMENTS: decreased balance, decreased coordination, increased fascial restrictions, impaired UE functional use, and pain.   ACTIVITY LIMITATIONS: squatting and locomotion level  PARTICIPATION LIMITATIONS: community activity  PERSONAL FACTORS: 1 comorbidity: History of hypermobile joints are also affecting patient's functional outcome.   REHAB POTENTIAL: Excellent  CLINICAL DECISION MAKING: Stable/uncomplicated  EVALUATION COMPLEXITY: Low   GOALS: Goals reviewed with patient? Yes  LONG TERM GOALS: Target date: 06/20/2024    Patient will be  independent with final HEP upon discharge from PT and report consistent benefit following exercise completion.    Baseline:  Goal status: INITIAL  2.  Patient will be able to demonstrate proper posture and lifting techniques related to spine health and reduction of symptoms.   Baseline:  Goal status: INITIAL  3.  Patient will return to community based exercise program with modifications if necessary for general health and fitness.  Baseline:  Goal status: INITIAL  4.  Patient will demonstrate symmetrical squat without increased pain in right knee Baseline:  Goal status: INITIAL  5.  Patient will show symmetrical dynamic balance activities for single-leg stance Baseline:  Goal status: INITIAL    PLAN:  PT FREQUENCY: 1x/week  PT DURATION: 4 weeks  PLANNED INTERVENTIONS: 97164- PT Re-evaluation, 97750- Physical Performance Testing, 97110-Therapeutic exercises, 97530- Therapeutic activity, [id number]- Neuromuscular re-education, 97535- Self Care, 02859- Manual therapy, Patient/Family education, Balance training, Cryotherapy, and Moist heat  PLAN FOR NEXT SESSION: land , core HEP    Trinton Prewitt, PT 05/30/2024, 1:57 PM   Delon Norma, PT 05/30/24 1:57 PM Phone: [phone] Fax: [phone]

## 2024-05-31 ENCOUNTER — Encounter: Admitting: Physical Therapy

## 2024-05-31 ENCOUNTER — Ambulatory Visit (INDEPENDENT_AMBULATORY_CARE_PROVIDER_SITE_OTHER): Admitting: Physical Therapy

## 2024-05-31 ENCOUNTER — Encounter: Payer: Self-pay | Admitting: Physical Therapy

## 2024-05-31 DIAGNOSIS — M79605 Pain in left leg: Secondary | ICD-10-CM

## 2024-05-31 NOTE — Therapy (Signed)
 OUTPATIENT PHYSICAL THERAPY LOWER EXTREMITY NOTE    Patient Name: Andrea Reyes MRN: [id number] DOB:Mar 24, 1971, 53 y.o., female Today's Date: 05/31/2024  END OF SESSION:  PT End of Session - 05/31/24 1106     Visit Number 4    Number of Visits 5    Date for Recertification  07/04/24    Authorization Type BCBS    Authorization Time Period Submitted 10/9 for 10/8    PT Start Time 1103            Past Medical History:  Diagnosis Date   Allergy    Anemia    Anxiety    Arthritis    Basal cell carcinoma    Depression    Ehlers-Danlos, hypermobile type    Eosinophilic esophagitis    GERD (gastroesophageal reflux disease)    Hyperlipidemia    Laryngopharyngeal reflux (LPR)    Vestibular schwannoma (HCC)    Past Surgical History:  Procedure Laterality Date   BUNIONECTOMY Left    on top of left big toe   COLONOSCOPY     EXCISION VAGINAL CYST     SEPTOPLASTY  09/2018   UPPER GASTROINTESTINAL ENDOSCOPY     Patient Active Problem List   Diagnosis Date Noted   Hypermobile Ehlers-Danlos syndrome 05/24/2024   Laryngopharyngeal reflux 09/24/2023   Sensorineural hearing loss, bilateral 09/24/2023   Chronic sinusitis 04/24/2018   Perennial allergic rhinitis with a probable nonallergic component 04/24/2018   Acoustic neuroma (HCC) 04/24/2018    PCP: Rolinda Millman MD   REFERRING PROVIDER: Joane Birmingham MD   REFERRING DIAG: [phone] (ICD-10-CM) - Chronic pain of right ankle M25.561,G89.29 (ICD-10-CM) - Chronic pain of right knee  THERAPY DIAG:  No diagnosis found.  Rationale for Evaluation and Treatment: Rehabilitation  ONSET DATE: 4-5 mos   SUBJECTIVE:   SUBJECTIVE STATEMENT:  The ITB scraping helps.  Rt foot is OK, getting a sleeve  Neck and Rt Arm can be painful.  Started when I elevated the bed.      Patient is here for Rt knee and ankle pain in the setting of hypermobility.  Both of her children have hypermobile Ehlers-Danlos syndrome and she was  told that she may have this as well.  While using the [facility] machine 4-5 mos ago the bar came down and she twisted her ankle and knee.  She saw the doctor due to lateral knee pain after that.    Yeah she is working with a systems analyst for 18 mos.  2 x per week.  She also does 3 strength days.  She wants a lower impact cardio option.   She has also seen Darlynn Pacini and another PT for this.  She has difficulty with her neck and shoulders at times, due to muscle imbalances and hypermobility syndrome.   She was told that the L side of her body is not working like the Rt side    PERTINENT HISTORY: Vestibular schwannoma    PAIN:  Are you having pain? Yes: NPRS scale: no pain in ankle or knee  Pain location: L lateral knee, L lateral ankle  Pain description: sore Aggravating factors: inversion of ankle,  Relieving factors: rest?  Had some pain Friday.    PRECAUTIONS: None  RED FLAGS: None   WEIGHT BEARING RESTRICTIONS: No  FALLS:  Has patient fallen in last 6 months? 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  DIAGNOSTIC FINDINGS: normal XR in knee and ankle.   PATIENT SURVEYS:  NT   COGNITION: Overall cognitive status: Within functional limits for tasks assessed     SENSATION: WFL  EDEMA:  None   MUSCLE LENGTH: Hamstrings: WNL   POSTURE: low L shoulder, L pelvis retracted    PALPATION: Min tenderness lateral ankle below lateral calcaneus   LOWER EXTREMITY ROM: WNL  Good hip ER and IR but not hypermobile  Active ROM Right eval Left eval  Hip flexion    Hip extension    Hip abduction    Hip adduction    Hip internal rotation    Hip external rotation    Knee flexion    Knee extension +7 +7   Ankle dorsiflexion    Ankle plantarflexion    Ankle inversion    Ankle eversion     (Blank rows = not tested)  LOWER EXTREMITY MMT:  MMT Right eval Left eval  Hip flexion    Hip extension    Hip abduction    Hip adduction    Hip internal rotation    Hip external rotation    Knee flexion    Knee extension    Ankle dorsiflexion    Ankle plantarflexion    Ankle inversion    Ankle eversion     (Blank rows = not tested)  LOWER EXTREMITY SPECIAL TESTS:  Hip special tests: Ober's test: negative  FUNCTIONAL TESTS:  Squat asymmetry noted, weight shifted to Rt LE   SLS WNL, more effort and less stable on L side  GAIT: Distance walked: 150+ Assistive device utilized: None Level of assistance: Complete Independence Comments: no deviations noted in clinic                                                                                                      TREATMENT DATE:   [facility] Adult PT Treatment:                                                DATE: 05/31/24  Neuromuscular re-ed: Pilates Tower for LE/Core strength, postural strength, lumbopelvic disassociation and core control.  Exercises included: Standing scapular arm springs  Row, extension and retraction  Supine Leg Springs purple single leg arcs and circle  Double leg arcs, circles and squats  Bridging articulation  x 10  90/90 hold added dead bug variation   [facility] Adult PT Treatment:                                                DATE: 05/23/24 Neuromuscular re-ed: Pilates Reformer used for LE/core strength, postural strength, lumbopelvic disassociation and core control.  Exercises  included: Footwork  Double leg Parallel heels, toes narrow and wide Pilates V heels and toes narrow and wide  2 red 1 blue 1 yellow  Single leg  Used ball under pelvis added SLR x 10  90/90 hip extension ball under hips  Banded bent knee fall out x 15  Bridging blue band on thighs  Supine Arm work 1 red 1 yellow arcs in parallel, V    Feet in Straps 1 red 1 yellow Arcs in parallel and V  Frog squats x 10 Circles  x 10 each direction   [facility] Adult PT Treatment:                                                DATE: 05/15/24 Manual Therapy: IASTM Rt lateral hip , ITB and TFL Neuromuscular re-ed: Core activation TrA via A/P tilt  2023/10/28  SLR with UE (dead bug variation) 90/90 Bridging with ball  Figure 4  Footwork  Double leg Parallel heels, toes narrow and wide Pilates V heels and toes narrow and wide  2 red 1 blue 1 yellow  Single leg   05/09/24  PT eval completed, discussed plan to return to this location for a few sessions focusing on overall body symmetry posture alignment core control and use of Pilates equipment for neuromuscular reeducation. Spent time discussing recommendations for structuring her weekly fitness routine  PATIENT EDUCATION:  Education details: see above Person educated: Patient Education method: Explanation, Demonstration, and Handouts Education comprehension: verbalized understanding, returned demonstration, and needs further education  HOME EXERCISE PROGRAM:Access Code: [email or web address] URL: [email or web address] Date: 05/31/2024 Prepared by: Delon Norma  Exercises - Pilates Bridge  - 1 x daily - 7 x weekly - 2 sets - 10 reps - 5 hold - Supine 90/90 Abdominal Bracing  - 1 x daily - 7 x weekly - 1 sets - 5 reps - 30 hold - Supine 90/90 with Leg Extensions  - 1 x daily - 7 x weekly - 2 sets - 10 reps - 5 hold - Supine Dead Bug with Leg Extension  - 1 x daily - 7 x weekly - 2 sets - 10 reps - 5 hold   ASSESSMENT:  CLINICAL IMPRESSION: Patient able to work on standing and supine exercises to balance her posture, bring awareness to deep core muscles and alignment of the neck, upper and lower body.  Given HEP for spine articulation and control and neutral spine core routine.  She continues to need cues to let go on neck tension with both upper and lower body exercises.  Cues  given to slow the pace and calm breath.  Patient will continue to benefit from skilled PT in order to return to PLOF and optimize functional mobility.     Patient is a 53 y.o. female who was seen today for physical therapy evaluation and treatment for right knee and right ankle pain.  Patient is interested in returning to physical therapy for treatment of her hypermobility syndrome and addressing muscular imbalances.  OBJECTIVE IMPAIRMENTS: decreased balance, decreased coordination, increased fascial restrictions, impaired UE functional use, and pain.   ACTIVITY LIMITATIONS: squatting and locomotion level  PARTICIPATION LIMITATIONS: community activity  PERSONAL FACTORS: 1 comorbidity: History of hypermobile joints are also affecting patient's functional outcome.   REHAB POTENTIAL: Excellent  CLINICAL DECISION MAKING: Stable/uncomplicated  EVALUATION COMPLEXITY: Low   GOALS: Goals  reviewed with patient? Yes  LONG TERM GOALS: Target date: 06/20/2024    Patient will be independent with final HEP upon discharge from PT and report consistent benefit following exercise completion.    Baseline:  Goal status: INITIAL  2.  Patient will be able to demonstrate proper posture and lifting techniques related to spine health and reduction of symptoms.   Baseline:  Goal status: INITIAL  3.  Patient will return to community based exercise program with modifications if necessary for general health and fitness.  Baseline:  Goal status: INITIAL  4.  Patient will demonstrate symmetrical squat without increased pain in right knee Baseline:  Goal status: INITIAL  5.  Patient will show symmetrical dynamic balance activities for single-leg stance Baseline:  Goal status: INITIAL    PLAN:  PT FREQUENCY: 1x/week  PT DURATION: 4 weeks  PLANNED INTERVENTIONS: 97164- PT Re-evaluation, 97750- Physical Performance Testing, 97110-Therapeutic exercises, 97530- Therapeutic activity, [id number]-  Neuromuscular re-education, 97535- Self Care, 02859- Manual therapy, Patient/Family education, Balance training, Cryotherapy, and Moist heat  PLAN FOR NEXT SESSION: land , core HEP    Litisha Guagliardo, PT 05/31/2024, 11:06 AM   Delon Norma, PT 05/31/24 11:06 AM Phone: [phone] Fax: [phone]

## 2024-06-06 ENCOUNTER — Encounter: Payer: Self-pay | Admitting: Physical Therapy

## 2024-06-06 ENCOUNTER — Ambulatory Visit (INDEPENDENT_AMBULATORY_CARE_PROVIDER_SITE_OTHER): Admitting: Physical Therapy

## 2024-06-06 DIAGNOSIS — M79605 Pain in left leg: Secondary | ICD-10-CM

## 2024-06-06 NOTE — Therapy (Signed)
 OUTPATIENT PHYSICAL THERAPY LOWER EXTREMITY NOTE  Discharge   Patient Name: Andrea Reyes MRN: [id number] DOB:10-18-1970, 53 y.o., female Today's Date: 06/06/2024   PHYSICAL THERAPY DISCHARGE SUMMARY  Visits from Start of Care: 5  Current functional level related to goals / functional outcomes: See below   Remaining deficits: Muscle tension, excessive/compensatory   Education / Equipment: HEP, pilates, core and posture    Patient agrees to discharge. Patient goals were met. Patient is being discharged due to maximized rehab potential.    END OF SESSION:  PT End of Session - 06/06/24 0805     Visit Number 5    Number of Visits 5    Date for Recertification  07/04/24    Authorization Type BCBS    Authorization Time Period Submitted 10/9 for 10/8    Authorization - Visit Number 5    Authorization - Number of Visits 5    PT Start Time 0803    PT Stop Time 0845    PT Time Calculation (min) 42 min    Activity Tolerance Patient tolerated treatment well    Behavior During Therapy WFL for tasks assessed/performed             Past Medical History:  Diagnosis Date   Allergy    Anemia    Anxiety    Arthritis    Basal cell carcinoma    Depression    Ehlers-Danlos, hypermobile type    Eosinophilic esophagitis    GERD (gastroesophageal reflux disease)    Hyperlipidemia    Laryngopharyngeal reflux (LPR)    Vestibular schwannoma (HCC)    Past Surgical History:  Procedure Laterality Date   BUNIONECTOMY Left    on top of left big toe   COLONOSCOPY     EXCISION VAGINAL CYST     SEPTOPLASTY  09/2018   UPPER GASTROINTESTINAL ENDOSCOPY     Patient Active Problem List   Diagnosis Date Noted   Hypermobile Ehlers-Danlos syndrome 05/24/2024   Laryngopharyngeal reflux 09/24/2023   Sensorineural hearing loss, bilateral 09/24/2023   Chronic sinusitis 04/24/2018   Perennial allergic rhinitis with a probable nonallergic component 04/24/2018   Acoustic neuroma (HCC)  04/24/2018    PCP: Rolinda Millman MD   REFERRING PROVIDER: Joane Birmingham MD   REFERRING DIAG: [phone] (ICD-10-CM) - Chronic pain of right ankle M25.561,G89.29 (ICD-10-CM) - Chronic pain of right knee  THERAPY DIAG:  Pain of left lower extremity  Rationale for Evaluation and Treatment: Rehabilitation  ONSET DATE: 4-5 mos   SUBJECTIVE:   SUBJECTIVE STATEMENT: I felt last weeks session in hips. No pain right now. Ankle pain and some radiating pain in neck and arms.  This started when she bought a wedge and was sleeping inclined (Referred for reflux).     Patient is here for Rt knee and ankle pain in the setting of hypermobility.  Both of her children have hypermobile Ehlers-Danlos syndrome and she was told that she may have this as well.  While using the [facility] machine 4-5 mos ago the bar came down and she twisted her ankle and knee.  She saw the doctor due to lateral knee pain after that.    Yeah she is working with a systems analyst for 18 mos.  2 x per week.  She also does 3 strength days.  She wants a lower impact cardio option.   She has also seen Darlynn Pacini and another PT for this.  She has difficulty with her neck and shoulders at times, due  to muscle imbalances and hypermobility syndrome.   She was told that the L side of her body is not working like the Rt side    PERTINENT HISTORY: Vestibular schwannoma    PAIN:  Are you having pain? Yes: NPRS scale: 1/10 Pain location: L lateral knee, L lateral ankle  Pain description: sore Aggravating factors: inversion of ankle,  Relieving factors: rest?  Had some pain Friday.   Neck/shoulder pain 2/10-3/10  PRECAUTIONS: None  RED FLAGS: None   WEIGHT BEARING RESTRICTIONS: No  FALLS:  Has patient fallen in last 6 months? No  LIVING ENVIRONMENT: Lives with: lives with their family Lives in: House/apartment Stairs: no issues  Has following equipment at home: None  OCCUPATION: NT  PLOF:  Independent  NEXT MD VISIT: unknown   OBJECTIVE:  Note: Objective measures were completed at Evaluation unless otherwise noted.  DIAGNOSTIC FINDINGS: normal XR in knee and ankle.   PATIENT SURVEYS:  NT   COGNITION: Overall cognitive status: Within functional limits for tasks assessed     SENSATION: WFL  EDEMA:  None   MUSCLE LENGTH: Hamstrings: WNL   POSTURE: low L shoulder, L pelvis retracted    PALPATION: Min tenderness lateral ankle below lateral calcaneus   LOWER EXTREMITY ROM: WNL  Good hip ER and IR but not hypermobile  Active ROM Right eval Left eval  Hip flexion    Hip extension    Hip abduction    Hip adduction    Hip internal rotation    Hip external rotation    Knee flexion    Knee extension +7 +7  Ankle dorsiflexion    Ankle plantarflexion    Ankle inversion    Ankle eversion     (Blank rows = not tested)  LOWER EXTREMITY MMT:  MMT Right eval Left eval  Hip flexion    Hip extension    Hip abduction    Hip adduction    Hip internal rotation    Hip external rotation    Knee flexion    Knee extension    Ankle dorsiflexion    Ankle plantarflexion    Ankle inversion    Ankle eversion     (Blank rows = not tested)  LOWER EXTREMITY SPECIAL TESTS:  Hip special tests: Ober's test: negative  FUNCTIONAL TESTS:  Squat asymmetry noted, weight shifted to Rt LE   SLS WNL, more effort and less stable on L side  GAIT: Distance walked: 150+ Assistive device utilized: None Level of assistance: Complete Independence Comments: no deviations noted in clinic                                                                                                      TREATMENT DATE:   [facility] Adult PT Treatment:                                                DATE: 06/06/24 Therapeutic Activity: Seated cervical mobility,  towel exercises (UT, levator scap)  Pelvic drop off step  Hip abduction off step  Calf raise eccentric  Standing hip hinge on Airex   Hip hinge on floor  Long box Pulling Straps 1 red x 10  Long box Hip Abduction   [facility] Adult PT Treatment:                                                DATE: 05/31/24  Neuromuscular re-ed: Pilates Tower for LE/Core strength, postural strength, lumbopelvic disassociation and core control.  Exercises included: Standing scapular arm springs  Row, extension and retraction  Supine Leg [place] purple single leg arcs and circle  Double leg arcs, circles and squats  Bridging articulation  x 10  90/90 hold added dead bug variation   [facility] Adult PT Treatment:                                                DATE: 05/23/24 Neuromuscular re-ed: Pilates Reformer used for LE/core strength, postural strength, lumbopelvic disassociation and core control.  Exercises included: Footwork  Double leg Parallel heels, toes narrow and wide Pilates V heels and toes narrow and wide  2 red 1 blue 1 yellow  Single leg  Used ball under pelvis added SLR x 10  90/90 hip extension ball under hips  Banded bent knee fall out x 15  Bridging blue band on thighs  Supine Arm work 1 red 1 yellow arcs in parallel, V   Feet in Straps 1 red 1 yellow Arcs in parallel and V  Frog squats x 10 Circles  x 10 each direction   [facility] Adult PT Treatment:                                                DATE: 05/15/24 Manual Therapy: IASTM Rt lateral hip , ITB and TFL Neuromuscular re-ed: Core activation TrA via A/P tilt  10/25/2023  SLR with UE (dead bug variation) 90/90 Bridging with ball  Figure 4  Footwork  Double leg Parallel heels, toes narrow and wide Pilates V heels and toes narrow and wide  2 red 1 blue 1 yellow  Single leg   05/09/24  PT eval completed, discussed plan to return to this location for a few sessions focusing on overall body symmetry posture alignment core control and use of Pilates equipment for neuromuscular reeducation. Spent time discussing recommendations for structuring her weekly fitness  routine  PATIENT EDUCATION:  Education details: see above Person educated: Patient Education method: Explanation, Demonstration, and Handouts Education comprehension: verbalized understanding, returned demonstration, and needs further education  HOME EXERCISE PROGRAM:Access Code: [email or web address] URL: [email or web address] Date: 05/31/2024 Prepared by: Delon Norma  Exercises - Pilates Bridge  - 1 x daily - 7 x weekly - 2 sets - 10 reps - 5 hold - Supine 90/90 Abdominal Bracing  - 1 x daily - 7 x weekly - 1 sets - 5 reps - 30 hold - Supine 90/90 with Leg Extensions  - 1 x daily - 7 x weekly - 2 sets - 10 reps - 5 hold -  Supine Dead Bug with Leg Extension  - 1 x daily - 7 x weekly - 2 sets - 10 reps - 5 hold   ASSESSMENT:  CLINICAL IMPRESSION: Patient was able to complete 5 visits of physical therapy addressing right knee ankle strength and stability with closed chain exercises. She was introduced to the Pilates environment for balancing postural imbalances. Overall she has excellent body awareness, a symmetrical hip hinge and requires min cues for technique. She exhibits a tension in her neck and shoulders which is compensatory for hypermobility in her body.   Patient is a 53 y.o. female who was seen today for physical therapy evaluation and treatment for right knee and right ankle pain.  Patient is interested in returning to physical therapy for treatment of her hypermobility syndrome and addressing muscular imbalances.  OBJECTIVE IMPAIRMENTS: decreased balance, decreased coordination, increased fascial restrictions, impaired UE functional use, and pain.   ACTIVITY LIMITATIONS: squatting and locomotion level  PARTICIPATION LIMITATIONS: community activity  PERSONAL FACTORS: 1 comorbidity: History of hypermobile joints are also affecting patient's functional outcome.   REHAB POTENTIAL: Excellent  CLINICAL DECISION MAKING: Stable/uncomplicated  EVALUATION COMPLEXITY:  Low   GOALS: Goals reviewed with patient? Yes  LONG TERM GOALS: Target date: 06/20/2024    Patient will be independent with final HEP upon discharge from PT and report consistent benefit following exercise completion.    Baseline:  Goal status: met  2.  Patient will be able to demonstrate proper posture and lifting techniques related to spine health and reduction of symptoms.   Baseline:  Goal status: met  3.  Patient will return to community based exercise program with modifications if necessary for general health and fitness.  Baseline:  Goal status: met  4.  Patient will demonstrate symmetrical squat without increased pain in right knee Baseline:  Goal status: partially met, intermittent Rt knee and ankle pain.   5.  Patient will show symmetrical dynamic balance activities for single-leg stance Baseline:  Goal status: INITIAL    PLAN:  PT FREQUENCY: 1x/week  PT DURATION: 4 weeks  PLANNED INTERVENTIONS: 97164- PT Re-evaluation, 97750- Physical Performance Testing, 97110-Therapeutic exercises, 97530- Therapeutic activity, [id number]- Neuromuscular re-education, 97535- Self Care, 02859- Manual therapy, Patient/Family education, Balance training, Cryotherapy, and Moist heat  PLAN FOR NEXT SESSION:DC, NA    Rufina Kimery, PT 06/06/2024, 6:14 PM    Delon Norma, PT 06/06/24 6:20 PM Phone: [phone] Fax: [phone]  Delon Norma, PT 06/06/24 6:14 PM Phone: [phone] Fax: [phone]

## 2024-06-12 ENCOUNTER — Encounter: Admitting: Physical Therapy

## 2024-06-21 DIAGNOSIS — Z23 Encounter for immunization: Secondary | ICD-10-CM | POA: Diagnosis not present

## 2024-08-21 NOTE — Progress Notes (Unsigned)
 "        I, Ileana Collet, PhD, LAT, ATC acting as a scribe for Artist Lloyd, MD.  Andrea Reyes is a 54 y.o. female who presents to [facility] today for bilat knee and arm pain. Pt was last seen by Dr. Lloyd on 05/24/24 for R ankle pain. Pt was d/c from PT on 06/06/24.  Today, pt c/o bilat knee and arm pain in the setting of hEDS. She is wanting to understand what pain she should be concerned about and what is related to her hEDS. Pain is located along the anterior-medial aspect of bilat knees, R>L. UE pain is located along bilat trapz, radiating into bilat arms and hands.   Pain is focused around the thumbs bilaterally at the base of the thumb.  Pertinent review of systems: No fevers or chills  Relevant historical information: Ehlers-Danlos syndrome.   Exam:  BP 134/86   Pulse 72   Ht 5' 1 (1.549 m)   Wt 138 lb (62.6 kg)   LMP 10/15/2020 Comment: irregular  SpO2 96%   BMI 26.07 kg/m  General: Well Developed, well nourished, and in no acute distress.   MSK: Hands bilaterally bossing at first [facility].  Tender palpation in this region.  Normal motion and strength.  Right knee normal-appearing Tender palpation medial joint line.  Normal motion intact strength positive medial McMurray's test.    Lab and Radiology Results  Procedure: Real-time Ultrasound Guided Injection of right knee joint superior lateral patella space Device: Philips Affiniti 50G/GE Logiq Images permanently stored and available for review in PACS Verbal informed consent obtained.  Discussed risks and benefits of procedure. Warned about infection, bleeding, hyperglycemia damage to structures among others. Patient expresses understanding and agreement Time-out conducted.   Noted no overlying erythema, induration, or other signs of local infection.   Skin prepped in a sterile fashion.   Local anesthesia: Topical Ethyl chloride.   With sterile technique and under real time ultrasound  guidance: 40 mg of Kenalog  and 2 mL of Marcaine injected into knee joint. Fluid seen entering the joint capsule.   Completed without difficulty   Pain immediately resolved suggesting accurate placement of the medication.   Advised to call if fevers/chills, erythema, induration, drainage, or persistent bleeding.   Images permanently stored and available for review in the ultrasound unit.  Impression: Technically successful ultrasound guided injection.   X-ray images bilateral hands and right knee obtained today personally and independently interpreted  Right hand: Mild first CMC DJD.  Left hand: Mild first CMC DJD  Right knee: Minimal DJD.  No acute fractures.  Await formal radiology review     Assessment and Plan: 54 y.o. female with bilateral hand pain in the setting of hypermobile Ehlers-Danlos syndrome.  Pain focused around the [facility] where she does have a little bit of degenerative changes.  Plan for heat and CMC bracing and continued physical therapy.  If needed we could do specialized occupational therapy for her hands with a MyChart message or phone call.  She will let me know.  Right knee pain exacerbation of DJD.  Degenerative meniscus tear.  This also occurs with hypermobile Ehlers-Danlos syndrome.  Plan for injection today.  We talked about basic strategies as well.   PDMP not reviewed this encounter. Orders Placed This Encounter  Procedures   US  LIMITED JOINT SPACE STRUCTURES LOW BILAT(NO LINKED CHARGES)    Reason for Exam (SYMPTOM  OR DIAGNOSIS REQUIRED):   bilateral knee pain  Preferred imaging location?:   [facility] Knee AP/LAT W/[place] Right    Standing Status:   Future    Number of Occurrences:   1    Expiration Date:   09/22/2024    Reason for Exam (SYMPTOM  OR DIAGNOSIS REQUIRED):   right knee pain    Preferred imaging location?:   [place]    Is patient pregnant?:   No   DG Hand Complete Left    Standing Status:    Future    Number of Occurrences:   1    Expiration Date:   09/22/2024    Reason for Exam (SYMPTOM  OR DIAGNOSIS REQUIRED):   bilateral hand pain    Preferred imaging location?:   [place]    Is patient pregnant?:   No   DG Hand Complete Right    Standing Status:   Future    Number of Occurrences:   1    Expiration Date:   09/22/2024    Reason for Exam (SYMPTOM  OR DIAGNOSIS REQUIRED):   bilateral hand pain    Preferred imaging location?:   [place]    Is patient pregnant?:   No   No orders of the defined types were placed in this encounter.    Discussed warning signs or symptoms. Please see discharge instructions. Patient expresses understanding.   The above documentation has been reviewed and is accurate and complete Artist Lloyd, M.D.   "

## 2024-08-22 ENCOUNTER — Other Ambulatory Visit: Payer: Self-pay

## 2024-08-22 ENCOUNTER — Ambulatory Visit

## 2024-08-22 ENCOUNTER — Ambulatory Visit: Admitting: Family Medicine

## 2024-08-22 VITALS — BP 134/86 | HR 72 | Ht 61.0 in | Wt 138.0 lb

## 2024-08-22 DIAGNOSIS — M25561 Pain in right knee: Secondary | ICD-10-CM | POA: Diagnosis not present

## 2024-08-22 DIAGNOSIS — G8929 Other chronic pain: Secondary | ICD-10-CM

## 2024-08-22 DIAGNOSIS — M79642 Pain in left hand: Secondary | ICD-10-CM | POA: Diagnosis not present

## 2024-08-22 DIAGNOSIS — M79641 Pain in right hand: Secondary | ICD-10-CM | POA: Diagnosis not present

## 2024-08-22 DIAGNOSIS — Q7962 Hypermobile Ehlers-Danlos syndrome: Secondary | ICD-10-CM | POA: Diagnosis not present

## 2024-08-22 NOTE — Patient Instructions (Addendum)
 Thank you for coming in today.   Please get an Xray today before you leave   Thumb spica wrist brace  Please use Voltaren gel (Generic Diclofenac Gel) up to 4x daily for pain as needed.  This is available over-the-counter as both the name brand Voltaren gel and the generic diclofenac gel.   Let me know if we need to do hand therapy  Check back as needed

## 2024-08-23 ENCOUNTER — Encounter: Payer: Self-pay | Admitting: Family Medicine

## 2024-08-28 ENCOUNTER — Ambulatory Visit: Payer: Self-pay | Admitting: Family Medicine

## 2024-08-28 NOTE — Progress Notes (Signed)
Left hand x-ray looks okay to radiology

## 2024-08-28 NOTE — Progress Notes (Signed)
Right knee x-ray looks okay to radiology.

## 2024-08-28 NOTE — Progress Notes (Signed)
Right hand x-ray looks okay to radiology.
